# Patient Record
Sex: Female | Born: 1985 | Race: Black or African American | Hispanic: No | Marital: Single | Smoking: Former smoker
Health system: Southern US, Community
[De-identification: ages and names within clinical notes are randomized; demographics above are authoritative.]

## PROBLEM LIST (undated history)

## (undated) DIAGNOSIS — A048 Other specified bacterial intestinal infections: Secondary | ICD-10-CM

## (undated) DIAGNOSIS — K219 Gastro-esophageal reflux disease without esophagitis: Secondary | ICD-10-CM

## (undated) DIAGNOSIS — M722 Plantar fascial fibromatosis: Secondary | ICD-10-CM

## (undated) DIAGNOSIS — D649 Anemia, unspecified: Secondary | ICD-10-CM

## (undated) DIAGNOSIS — T7840XA Allergy, unspecified, initial encounter: Secondary | ICD-10-CM

## (undated) DIAGNOSIS — H5010 Unspecified exotropia: Secondary | ICD-10-CM

## (undated) DIAGNOSIS — H50112 Monocular exotropia, left eye: Secondary | ICD-10-CM

## (undated) DIAGNOSIS — R51 Headache: Secondary | ICD-10-CM

## (undated) HISTORY — DX: Monocular exotropia, left eye: H50.112

## (undated) HISTORY — DX: Other specified bacterial intestinal infections: A04.8

## (undated) HISTORY — DX: Unspecified exotropia: H50.10

## (undated) HISTORY — DX: Headache: R51

## (undated) HISTORY — PX: OTHER SURGICAL HISTORY: SHX169

## (undated) HISTORY — DX: Gastro-esophageal reflux disease without esophagitis: K21.9

## (undated) HISTORY — DX: Anemia, unspecified: D64.9

## (undated) HISTORY — DX: Allergy, unspecified, initial encounter: T78.40XA

---
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
## 2005-05-03 ENCOUNTER — Other Ambulatory Visit: Admission: RE | Admit: 2005-05-03 | Discharge: 2005-05-03 | Payer: Self-pay | Admitting: Obstetrics and Gynecology

## 2005-05-05 ENCOUNTER — Encounter: Admission: RE | Admit: 2005-05-05 | Discharge: 2005-05-05 | Payer: Self-pay | Admitting: Obstetrics and Gynecology

## 2006-05-04 ENCOUNTER — Other Ambulatory Visit: Admission: RE | Admit: 2006-05-04 | Discharge: 2006-05-04 | Payer: Self-pay | Admitting: Obstetrics and Gynecology

## 2006-12-12 ENCOUNTER — Ambulatory Visit: Payer: Self-pay | Admitting: Family Medicine

## 2008-02-25 ENCOUNTER — Encounter (INDEPENDENT_AMBULATORY_CARE_PROVIDER_SITE_OTHER): Payer: Self-pay | Admitting: Family Medicine

## 2008-02-25 ENCOUNTER — Ambulatory Visit: Payer: Self-pay | Admitting: Family Medicine

## 2008-02-25 DIAGNOSIS — T148 Other injury of unspecified body region: Secondary | ICD-10-CM | POA: Insufficient documentation

## 2008-02-25 DIAGNOSIS — W57XXXA Bitten or stung by nonvenomous insect and other nonvenomous arthropods, initial encounter: Secondary | ICD-10-CM

## 2008-07-20 ENCOUNTER — Emergency Department (HOSPITAL_COMMUNITY): Admission: EM | Admit: 2008-07-20 | Discharge: 2008-07-20 | Payer: Self-pay | Admitting: Family Medicine

## 2008-07-21 ENCOUNTER — Emergency Department (HOSPITAL_COMMUNITY): Admission: EM | Admit: 2008-07-21 | Discharge: 2008-07-21 | Payer: Self-pay | Admitting: Emergency Medicine

## 2010-12-05 ENCOUNTER — Other Ambulatory Visit (HOSPITAL_COMMUNITY)
Admission: RE | Admit: 2010-12-05 | Discharge: 2010-12-05 | Disposition: A | Discharge status: Home / Self Care | Payer: BC Managed Care – PPO | Source: Ambulatory Visit | Attending: Family Medicine | Admitting: Family Medicine

## 2010-12-05 ENCOUNTER — Ambulatory Visit (INDEPENDENT_AMBULATORY_CARE_PROVIDER_SITE_OTHER): Payer: BC Managed Care – PPO | Admitting: Family Medicine

## 2010-12-05 ENCOUNTER — Encounter: Payer: Self-pay | Admitting: Family Medicine

## 2010-12-05 VITALS — BP 112/68 | HR 80 | Temp 100.2°F | Ht 67.5 in | Wt 180.0 lb

## 2010-12-05 DIAGNOSIS — N898 Other specified noninflammatory disorders of vagina: Secondary | ICD-10-CM

## 2010-12-05 DIAGNOSIS — Z124 Encounter for screening for malignant neoplasm of cervix: Secondary | ICD-10-CM

## 2010-12-05 DIAGNOSIS — Z01419 Encounter for gynecological examination (general) (routine) without abnormal findings: Secondary | ICD-10-CM | POA: Insufficient documentation

## 2010-12-05 DIAGNOSIS — Z20828 Contact with and (suspected) exposure to other viral communicable diseases: Secondary | ICD-10-CM

## 2010-12-05 MED ORDER — EPINEPHRINE 0.3 MG/0.3ML IJ DEVI: 0.3000 mg | Freq: Once | INTRAMUSCULAR | Status: DC

## 2010-12-05 NOTE — Patient Instructions (Signed)
It was nice to meet you today!  We are checking labs and will contact you when they are back.

## 2010-12-05 NOTE — Progress Notes (Signed)
  Subjective:     Jane Marshall is a 25 y.o. female and is here for a comprehensive physical exam. The patient reports no problems.  History   Social History  . Marital Status: Single    Spouse Name: N/A    Number of Children: N/A  . Years of Education: N/A   Occupational History  . Not on file.   Social History Main Topics  . Smoking status: Former Smoker    Types: Cigarettes  . Smokeless tobacco: Not on file  . Alcohol Use: Not on file  . Drug Use: Not on file  . Sexually Active: Not on file   Other Topics Concern  . Not on file   Social History Narrative  . No narrative on file   Health Maintenance  Topic Date Due  . Pap Smear  09/07/2004  . Tetanus/tdap  09/07/2005    The following portions of the patient's history were reviewed and updated as appropriate: allergies, current medications, past family history, past medical history, past social history, past surgical history and problem list.  Review of Systems Pertinent items are noted in HPI. No HA, dizziness, CP, SOB, N/V/D/C, LE edema.  Objective:    BP 112/68  Pulse 80  Temp(Src) 100.2 F (37.9 C) (Oral)  Ht 5' 7.5" (1.715 m)  Wt 180 lb (81.647 kg)  BMI 27.78 kg/m2  LMP 11/07/2010 General appearance: alert, cooperative and no distress Eyes: conjunctivae/corneas clear. PERRL, EOM's intact. Fundi benign. Ears: normal TM's and external ear canals both ears Nose: Nares normal. Septum midline. Mucosa normal. No drainage or sinus tenderness. Throat: lips, mucosa, and tongue normal; teeth and gums normal Neck: no adenopathy and thyroid not enlarged, symmetric, no tenderness/mass/nodules Back: no tenderness to percussion or palpation Lungs: clear to auscultation bilaterally Breasts: normal appearance, no masses or tenderness Heart: regular rate and rhythm, S1, S2 normal, no murmur, click, rub or gallop Abdomen: soft, non-tender; bowel sounds normal; no masses,  no organomegaly Pelvic: cervix normal in  appearance, external genitalia normal, no adnexal masses or tenderness, no cervical motion tenderness, uterus normal size, shape, and consistency and vagina normal without discharge Extremities: extremities normal, atraumatic, no cyanosis or edema Pulses: 2+ and symmetric Skin: Skin color, texture, turgor normal. No rashes or lesions or small resolving boil left inner thigh with no erythema, edema, or warmth.  Lymph nodes: Cervical, supraclavicular, and axillary nodes normal. Neurologic: Grossly normal    Assessment:    Healthy female exam.   Plan:     See After Visit Summary for Counseling Recommendations

## 2010-12-06 LAB — GC/CHLAMYDIA PROBE AMP, GENITAL: Chlamydia, DNA Probe: NEGATIVE

## 2010-12-09 ENCOUNTER — Encounter: Payer: Self-pay | Admitting: Family Medicine

## 2011-01-27 NOTE — Op Note (Signed)
[name]  Patient:    Jane Marshall, Jane Marshall                        MRN: [id number] Proc. Date: 03/07/01 Adm. 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  OPERATION PERFORMED:  Correction metatarsus prima vera and hallux valgus with distal first metatarsal osteotomy and exostosis excision.  Medial capsular reefing.  Release with capsulotomy, dorsal aspect, second MP joint to improve hyperextension contracture, second MP joint.  Fixation of osteotomy with bioabsorbable 2 mm Orthosorb pin.  SURGEON:  Loreta Ave, M.D.  ASSISTANT:  Arlys John D. Petrarca, P.A.-C.  ANESTHESIA:  General.  ESTIMATED BLOOD LOSS:  Minimal.  TOURNIQUET TIME:  45 minutes.  SPECIMENS:  None.  CULTURES:  None.  COMPLICATIONS:  None.  DRESSING:  Soft compressive with wooden shoe.  DESCRIPTION OF PROCEDURE:  The patient was brought to the operating room and placed on the operating table in supine position.  After adequate anesthesia had been obtained, tourniquet applied lower aspect of the right leg.  Prepped and draped in the usual sterile fashion.  Exsanguinated with elevation and Esmarch.  Tourniquet inflated to 250 mmHg.  Straight incision medial aspect of first MP joint.  The skin and subcutaneous tissues were divided.  Distal based capsular flap generated and retracted distally.  Exostosis distal first metatarsal excised with a saw.  Chevron osteotomy then created with fluoroscopic guidance.  Distal end of the first metatarsal transferred laterally 5 to 6 mm and then fixed with a premeasured predrilled 2 mm Orthosorb pin.  Remaining shaft then contoured smoothly  in line with the distal end after fixation of the osteotomy.  Wound irrigated.  Because of the contracture of the second MP joint, a longitudinal incision was made in the first web space.  Medial aspect of the second MP joint exposed, protecting the extensor tendon.  Capsulotomy all the way across the dorsal aspect of the second MP joint completed which resolved all fixed contracture and I could passively bring the second joint down into complete correction and into complete flexion.  Through that same incision, we then did a release of the lateral aspect first MP joint to allow correction of the hallux valgus.  Once complete, I then reefed the medial capsule first MP joint with a fixation with multiple 0 Vicryl sutures.  At completion, I had excellent alignment of all joints with good improvement of metatarsal prima vera osteotomy.  Overall alignment clinically and by C-arm was excellent.  Wounds were irrigated and closed with nylon at the skin.  Margins of the wound were injected with Marcaine.  Sterile compressive dressing with bulky trauma dressing and spacer between the first and second toes applied.  Wooden shoe applied.  Tourniquet inflated and removed.  Anesthesia reversed.  Brought to recovery room. Tolerated surgery well.  No complications. DD:  03/07/01 TD:  03/07/01 Job: [id number]

## 2011-01-27 NOTE — Op Note (Signed)
[place]  Patient:    Jane Marshall, Jane Marshall                        MRN: [id number] Proc. Date: 11/08/00 Adm. 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  POSTOPERATIVE DIAGNOSIS:  Metarsus prima varus with marked hallux valgus, left foot with hyperextension contracture, second metatarsophalangeal joint.  OPERATION PERFORMED:  Correction of hallux valgus deformity with excision of distal first metatarsal head.  Proximal first metatarsal dome osteotomy with 3.5 cannulated screw fixation.  Reefing first and second metatarsal heads with abductor hallucis release.  Release of second metatarsophalangeal joint for hyperextension contracture.  SURGEON:  Loreta Ave, M.D.  ASSISTANT:  Arlys John D. Petrarca, P.A.-C.  ANESTHESIA:  General.  ESTIMATED BLOOD LOSS:  Minimal.  TOURNIQUET TIME:  One hour.  SPECIMENS:  None.  CULTURES:  None.  COMPLICATIONS:  None.  DRESSING:  Soft compressive with Cam walker.  DESCRIPTION OF PROCEDURE:  The patient was brought to the operating room and placed on the operating table in supine position.  After adequate anesthesia had been obtained, tourniquet applied left calf.  Prepped and draped in the usual sterile fashion.  Exsanguinated with elevation and Esmarch.  Tourniquet inflated to 250 mmHg.  Three incisions made, one in the first webspace, one over the medial aspect of the first metatarsal head and one dorsal medial over the base of the first metatarsal.  At the base of the first metatarsal, subperiosteal exposure of the first metatarsal protecting neurovascular structures and tendons.  Dome osteotomy created avoiding the tarsal metatarsal joint.  This was sufficiently mobilized to allow for correction.  Attention was then turned to the first webspace.  Skin and subcutaneous  tissues divided. Blunt dissection used to expose the interval between the first and second metatarsal heads.  A release of the sesamoid was done longitudinally at the deep aspect laterally, first metatarsal joint.  The abductor and capsule were then released.  A nonabsorbable #1 Ethibond suture was then placed through the abductor through the capsule of the first metatarsal and the capsule of the second metatarsal and then tied down, correcting the distal gap between the metatarsal heads with motion created at the osteotomy.  The medial incision was then made.  The skin and subcutaneous tissues were divided.  Capsular flap created to reef the first metatarsal joint.  The prominent exostosis taken off the metatarsal head with a saw.  The wound irrigated.  No degenerative changers.  When this was complete, I went back to the base of the first metatarsal.   Corrected and then fixed with a 3.5 mm cannulated pin bringing the intermetatarsal angle to neutral.  The suture between the first and second metatarsal heads and the first webspace was then firmly tied.  At the distal medial aspect of the first metatarsal, the capsule was then reefed to acceptable slight valgus position with Vicryl.  Overall alignment was markedly improved but there was still hyperextension of the second MP joint.  This was corrected with release of the capsule dorsally, second MP joint.  Although it still dynamically tended to come up dorsally, it was very easily correctable and there were no flexion contractures.  Overall construct exam with  C-arm and alignments acceptable.  The wounds were all copiously irrigated and closed with Vicryl and nylon.  Margin of the wounds injected with Marcaine.  Sterile compressive dressing with bulky foot dressing and splint applied with Cam walker.  Tourniquet deflated and removed.  Anesthesia reversed.  Brought to recovery room.  Tolerated surgery well.  No complications. DD:   11/08/00 TD:  11/08/00 Job: [id number]

## 2011-05-26 ENCOUNTER — Ambulatory Visit (INDEPENDENT_AMBULATORY_CARE_PROVIDER_SITE_OTHER): Payer: BC Managed Care – PPO | Admitting: Family Medicine

## 2011-05-26 ENCOUNTER — Other Ambulatory Visit: Payer: Self-pay | Admitting: Family Medicine

## 2011-05-26 ENCOUNTER — Encounter: Payer: Self-pay | Admitting: Family Medicine

## 2011-05-26 VITALS — BP 123/84 | HR 94 | Temp 98.6°F | Wt 176.0 lb

## 2011-05-26 DIAGNOSIS — R1012 Left upper quadrant pain: Secondary | ICD-10-CM

## 2011-05-26 DIAGNOSIS — R51 Headache: Secondary | ICD-10-CM

## 2011-05-26 MED ORDER — OMEPRAZOLE 40 MG PO CPDR: 40.0000 mg | DELAYED_RELEASE_CAPSULE | Freq: Every day | ORAL | Status: DC

## 2011-05-26 MED ORDER — INDOMETHACIN 25 MG PO CAPS: 25.0000 mg | ORAL_CAPSULE | Freq: Two times a day (BID) | ORAL | Status: AC

## 2011-05-26 NOTE — Assessment & Plan Note (Signed)
I suspect this is gastritis. I need to evaluate whether this is recurrence of H. pylori or due to NSAID.   Plan to do urease breath test today.  If negative we will proceed with omeprazole.  If positive will proceed with quadruple therapy. Will followup results of tests.

## 2011-05-26 NOTE — Patient Instructions (Signed)
Thank you for coming in today. We will do the test today for your stomach pain.  I would like you to start on indomethacin 25 three times a day x 7 days.  We will follow up over phone on Wednesday or sooner. Call me at [phone]. It is also important that you take omeprazole to protect your stomach.

## 2011-05-26 NOTE — Progress Notes (Signed)
Ms. Jane Marshall presents to clinic today for headache and abdominal pain.  Headache: Starting for over 3 months but worse in the last 3 months. Headache is focused along the left eye it lasts from 30 minutes to one hour can occur up to 4 times a day and will often or occur multiple days in a row. She will sometimes have an interval of 2-3 days or she does not have headache. She denies any change in her visual acuity with headache. But she does note some floaters around the time she is having a headache. She denies any dizziness clumsiness weakness or numbness. She's been treating her headache with ibuprofen which has not helped much. Her sister was diagnosed with pseudotumor cerebra last year. And both her mother and her grandmother have had headaches.  Abdominal pain: Located in the epigastric region radiating to the left upper quadrant sharp in nature. Associated with diarrhea. The pain has been there for 6 days. The pain is constant but at times is worse.  Food may make it worse but not always. In high school she was diagnosed and treated for H. pylori there  PMH reviewed.  ROS as above otherwise neg Medications reviewed.  Exam:  BP 123/84  Pulse 94  Temp(Src) 98.6 F (37 C) (Oral)  Wt 176 lb (79.833 kg) Gen: Well NAD HEENT: EOMI,  MMM. Left eye exotropia. Optic discs have crisp margins and normal cup-to-disc ratios bilaterally. Lungs: CTABL Nl WOB Heart: RRR no MRG Abd: NABS, NT, ND. Mildly tender to palpation of the left upper quadrant no masses palpated over the entire abdomen. Exts: Non edematous BL  LE, warm and well perfused.  Neuro: Normal cranial nerves exam. Gait normal.

## 2011-05-26 NOTE — Assessment & Plan Note (Addendum)
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 I plan to treat with indomethacin 25 mg 3 times a day. I will follow up with phone on Wednesday with the patient.  If this is working we'll continue an attempt to taper.    Is not effective we'll increase to 50 mg 3 times a day for one week.  If indomethacin is not effective at all we'll suspect cluster headache.  At that point will use CT of the head as the odds ratio for cluster headache and intercranial abnormalities is 10, and start treatment with a triptan.

## 2011-06-13 LAB — POCT I-STAT, CHEM 8
BUN: 8 mg/dL (ref 6–23)
Calcium, Ion: 1.21 mmol/L (ref 1.12–1.32)
Chloride: 102 mEq/L (ref 96–112)
HCT: 45 % (ref 36.0–46.0)
Potassium: 4 mEq/L (ref 3.5–5.1)
Sodium: 139 mEq/L (ref 135–145)

## 2011-06-13 LAB — POCT URINALYSIS DIP (DEVICE)
Glucose, UA: NEGATIVE mg/dL
Nitrite: NEGATIVE
pH: >=9 (ref 5.0–8.0)

## 2011-06-13 LAB — WET PREP, GENITAL
Trich, Wet Prep: NONE SEEN
Yeast Wet Prep HPF POC: NONE SEEN

## 2011-06-13 LAB — POCT PREGNANCY, URINE: Preg Test, Ur: NEGATIVE

## 2011-06-13 LAB — GC/CHLAMYDIA PROBE AMP, GENITAL
Chlamydia, DNA Probe: POSITIVE — AB
GC Probe Amp, Genital: POSITIVE — AB

## 2011-10-18 ENCOUNTER — Telehealth: Payer: Self-pay | Admitting: Family Medicine

## 2011-10-18 ENCOUNTER — Other Ambulatory Visit (HOSPITAL_COMMUNITY)
Admission: RE | Admit: 2011-10-18 | Discharge: 2011-10-18 | Disposition: A | Discharge status: Home / Self Care | Payer: BC Managed Care – PPO | Source: Ambulatory Visit | Attending: Family Medicine | Admitting: Family Medicine

## 2011-10-18 ENCOUNTER — Encounter: Payer: Self-pay | Admitting: Family Medicine

## 2011-10-18 ENCOUNTER — Ambulatory Visit (INDEPENDENT_AMBULATORY_CARE_PROVIDER_SITE_OTHER): Payer: BC Managed Care – PPO | Admitting: Family Medicine

## 2011-10-18 VITALS — BP 113/74 | HR 84 | Ht 67.0 in | Wt 196.0 lb

## 2011-10-18 DIAGNOSIS — Z113 Encounter for screening for infections with a predominantly sexual mode of transmission: Secondary | ICD-10-CM | POA: Insufficient documentation

## 2011-10-18 DIAGNOSIS — Z20828 Contact with and (suspected) exposure to other viral communicable diseases: Secondary | ICD-10-CM

## 2011-10-18 LAB — POCT WET PREP (WET MOUNT): Clue Cells Wet Prep Whiff POC: POSITIVE

## 2011-10-18 MED ORDER — METRONIDAZOLE 500 MG PO TABS: 500.0000 mg | ORAL_TABLET | Freq: Two times a day (BID) | ORAL | Status: AC

## 2011-10-18 NOTE — Patient Instructions (Signed)
Thank you for coming in today, it was good to see you I will let you know the results of your tests when they return. Great job on quitting smoking!!  Keep it up! For your reflux you can try an over the counter medication called zantac (ranitidine) I will see you back in one year or sooner if needed

## 2011-10-18 NOTE — Progress Notes (Signed)
  Subjective:     Jane Marshall is a 26 y.o. female and is here for a comprehensive physical exam. The patient reports vaginal irritation x2 weeks.  Has not noticed discharge associated with this.  Does not believe she has had exposure to STD.  Last intercourse with female partner on 12/28, used condom at that time.  Denies abdominal pain, fever, dysuria.  History   Social History  . Marital Status: Single    Spouse Name: N/A    Number of Children: N/A  . Years of Education: N/A   Occupational History  . Not on file.   Social History Main Topics  . Smoking status: Former Smoker -- 0.2 packs/day    Types: Cigarettes, Cigars    Quit date: 09/12/2011  . Smokeless tobacco: Never Used  . Alcohol Use: 8.4 oz/week    14 Cans of beer per week     Binges on Weekend. Endorses sometimes needing "eye opener."  . Drug Use: No  . Sexually Active: Yes -- Female, Female partner(s)    Birth Control/ Protection: Condom     Bisexual   Other Topics Concern  . Not on file   Social History Narrative  . No narrative on file   Health Maintenance  Topic Date Due  . Tetanus/tdap  09/07/2005  . Influenza Vaccine  06/12/2011  . Pap Smear  12/04/2013    The following portions of the patient's history were reviewed and updated as appropriate: allergies, current medications, past family history, past medical history, past social history, past surgical history and problem list.  Review of Systems Pertinent items are noted in HPI.   Objective:    BP 113/74  Pulse 84  Ht 5\' 7"  (1.702 m)  Wt 196 lb (88.905 kg)  BMI 30.70 kg/m2 General appearance: alert, cooperative and no distress Head: Normocephalic, without obvious abnormality, atraumatic Eyes: conjunctivae/corneas clear. PERRL, EOM's intact. Fundi benign. Ears: normal TM's and external ear canals both ears Nose: Nares normal. Septum midline. Mucosa normal. No drainage or sinus tenderness. Throat: lips, mucosa, and tongue normal; teeth and gums  normal Neck: no adenopathy, supple, symmetrical, trachea midline and thyroid not enlarged, symmetric, no tenderness/mass/nodules Back: no tenderness to percussion or palpation Lungs: clear to auscultation bilaterally Heart: regular rate and rhythm, S1, S2 normal, no murmur, click, rub or gallop Abdomen: soft, non-tender; bowel sounds normal; no masses,  no organomegaly Pelvic: cervix normal in appearance, external genitalia normal, no adnexal masses or tenderness, no cervical motion tenderness, rectovaginal septum normal, uterus normal size, shape, and consistency and vagina with scant discharge Extremities: extremities normal, atraumatic, no cyanosis or edema Pulses: 2+ and symmetric Skin: Skin color, texture, turgor normal. No rashes or lesions Lymph nodes: Cervical, supraclavicular, and axillary nodes normal. Neurologic: Grossly normal    Assessment:    Healthy female exam.       Plan:     See After Visit Summary for Counseling Recommendations   Wet Prep, GC/Chlamydia done.

## 2011-10-18 NOTE — Telephone Encounter (Signed)
Called patient to give her results of wet prep.  Wet prep with BV will send over rx for flagyl x7 days.

## 2012-04-02 ENCOUNTER — Ambulatory Visit (INDEPENDENT_AMBULATORY_CARE_PROVIDER_SITE_OTHER): Payer: BC Managed Care – PPO | Admitting: Family Medicine

## 2012-04-02 ENCOUNTER — Encounter: Payer: Self-pay | Admitting: Family Medicine

## 2012-04-02 VITALS — BP 120/81 | HR 87 | Temp 99.1°F | Ht 67.0 in | Wt 191.0 lb

## 2012-04-02 DIAGNOSIS — R1110 Vomiting, unspecified: Secondary | ICD-10-CM | POA: Insufficient documentation

## 2012-04-02 LAB — POCT URINE PREGNANCY: Preg Test, Ur: NEGATIVE

## 2012-04-02 MED ORDER — ONDANSETRON HCL 4 MG PO TABS: 4.0000 mg | ORAL_TABLET | Freq: Three times a day (TID) | ORAL | Status: AC | PRN

## 2012-04-02 MED ORDER — OMEPRAZOLE 20 MG PO CPDR: 20.0000 mg | DELAYED_RELEASE_CAPSULE | Freq: Every day | ORAL | Status: DC

## 2012-04-02 NOTE — Patient Instructions (Signed)
Thank you for coming in today, it was good to see you I think your nausea and vomiting is likely related to your reflux I would like for you to try a medication called omeprazole.  I am also sending a medication for nausea in that you can use as needed.   If this is not improving in the next couple of weeks please return to see me.

## 2012-04-08 NOTE — Assessment & Plan Note (Addendum)
Suspect her nausea and vomiting and LUQ pain is likely from gastritis.  Negative urease breath test in 05/2011.  Will change her over from H2 blocker to PPI.  Pregnancy test negative. If no improvement can consider repeat urease breath test. Less likely gastroenteritis (no diarrhea), SBO ( having normal BM), and clinically does not appear to be pancreatitis.

## 2012-04-08 NOTE — Progress Notes (Signed)
  Subjective:    Patient ID: Jane Marshall, female    DOB: 1985/09/23, 26 y.o.   MRN: [id number]  HPI  1. Nausea/Emesis:  Patient with complaint of nausea and emesis that has been going on for a couple of weeks.  Vomiting only in the morning.  Typically only mucus or stomach contents.   Still having difficulty with increased heartburn, currently using ranitidine.   Occasional mild LUQ and epigastric pain, described as sharp and crampy at times. No difficulty with appetite or eating throughout the rest of the day.  Bowel movements have been normal.  She denies fever, chills, diarrhea, bloody or dark tarry stools.  Reports LMP on 7/01, does not believe she is pregnant.   Review of Systems Per HPI    Objective:   Physical Exam  Constitutional: She appears well-nourished. No distress.  Neck: Neck supple.  Cardiovascular: Normal rate and regular rhythm.   Pulmonary/Chest: Effort normal and breath sounds normal.  Abdominal: Soft. Bowel sounds are normal. She exhibits no distension. There is tenderness (Mild epigastric). There is no rebound and no guarding.          Assessment & Plan:

## 2013-10-29 ENCOUNTER — Encounter: Payer: Self-pay | Admitting: Family Medicine

## 2013-10-29 ENCOUNTER — Telehealth: Payer: Self-pay | Admitting: Family Medicine

## 2013-10-29 ENCOUNTER — Ambulatory Visit (INDEPENDENT_AMBULATORY_CARE_PROVIDER_SITE_OTHER): Payer: BC Managed Care – PPO | Admitting: Family Medicine

## 2013-10-29 VITALS — BP 119/88 | HR 91 | Temp 97.8°F | Ht 67.0 in | Wt 208.0 lb

## 2013-10-29 DIAGNOSIS — R1012 Left upper quadrant pain: Secondary | ICD-10-CM

## 2013-10-29 LAB — POCT URINALYSIS DIPSTICK
Bilirubin, UA: NEGATIVE
Blood, UA: NEGATIVE
GLUCOSE UA: NEGATIVE
KETONES UA: NEGATIVE
Leukocytes, UA: NEGATIVE
Nitrite, UA: NEGATIVE
Protein, UA: NEGATIVE
UROBILINOGEN UA: 0.2
pH, UA: 6

## 2013-10-29 LAB — POCT URINE PREGNANCY: PREG TEST UR: NEGATIVE

## 2013-10-29 MED ORDER — PROMETHAZINE HCL 25 MG PO TABS: 25.0000 mg | ORAL_TABLET | Freq: Three times a day (TID) | ORAL | Status: DC | PRN

## 2013-10-29 NOTE — Progress Notes (Signed)
Subjective:     Patient ID: Jane Marshall, female   DOB: 07-16-1986, 28 y.o.   MRN: [id number]  HPI Comments: She report N/V/D that began this morning at 5am. She reports sick contacts with her sister's kids yesterday who had GI symptoms. She also endorses LUQ abdominal pain that began after the vomiting which she describes as sharp constant non-radiating 8/10 pain. She reports chills but denies fevers. She denies any dysuria, Hx of kidney stone, UTIs, or ovarian cyst. LMP was ~ 6 weeks ago and [facility]. Denies recent alcohol or illicit drug use.     Diarrhea  Associated symptoms include abdominal pain, chills and vomiting.     Review of Systems  Constitutional: Positive for chills and appetite change.  Gastrointestinal: Positive for nausea, vomiting, abdominal pain and diarrhea. Negative for constipation and blood in stool.  Genitourinary: Negative for dysuria, frequency, hematuria, flank pain, difficulty urinating and menstrual problem.  Skin: Negative for rash.       Objective:   Physical Exam  Constitutional: She appears well-developed and well-nourished.  HENT:  Mouth/Throat: Oropharynx is clear and moist.  Cardiovascular: Normal rate, regular rhythm and normal heart sounds.   Pulmonary/Chest: Effort normal and breath sounds normal.  Abdominal:  LUQ tenderness to palpation. No rash, rebounding or guarding     Assessment/Plan:      See Problem Focused Assessment & Plan

## 2013-10-29 NOTE — Telephone Encounter (Signed)
Will fwd to Dr.Joyner who saw patient today to see if this is possible.  Grabiela Wohlford, Darlyne RussianKristen L, CMA

## 2013-10-29 NOTE — Assessment & Plan Note (Signed)
Pertinent S&O  N/V/D with know sick contacts  No hx of UTI, kidney stones or ovarian cyst  LMP ~ 6 wks Assessment  Check UA and Upreg Plan  Zofran for Nausea   Return to clinic for new/worsening symptoms

## 2013-10-29 NOTE — Telephone Encounter (Signed)
Would like an extra day on her return to work note. Would like to go back to work on Monday Please advise

## 2013-10-29 NOTE — Patient Instructions (Addendum)
It was great seeing you today.   1. You most likely have a viral GI bug 2. Zofran will help reduce the Nausea and vomiting; and Try and drink fluids to stay hydrated  3. Please call or return to clinic if you have new or worsening symptoms or are unable to keep enough fluids down to stay hydrated  I look forward to talking with you again at our next visit. If you have any questions or concerns before then, please call the clinic at [phone].  Take Care,   Dr Wenda LowJames Solomon Skowronek

## 2014-10-15 ENCOUNTER — Encounter: Payer: Self-pay | Admitting: Family Medicine

## 2014-10-15 ENCOUNTER — Other Ambulatory Visit (HOSPITAL_COMMUNITY)
Admission: RE | Admit: 2014-10-15 | Discharge: 2014-10-15 | Disposition: A | Discharge status: Home / Self Care | Payer: BC Managed Care – PPO | Source: Ambulatory Visit | Attending: Family Medicine | Admitting: Family Medicine

## 2014-10-15 ENCOUNTER — Ambulatory Visit (INDEPENDENT_AMBULATORY_CARE_PROVIDER_SITE_OTHER): Payer: BC Managed Care – PPO | Admitting: Family Medicine

## 2014-10-15 VITALS — BP 138/90 | HR 69 | Temp 98.4°F | Ht 67.0 in | Wt 214.9 lb

## 2014-10-15 DIAGNOSIS — Q6689 Other  specified congenital deformities of feet: Secondary | ICD-10-CM

## 2014-10-15 DIAGNOSIS — M21612 Bunion of left foot: Secondary | ICD-10-CM | POA: Insufficient documentation

## 2014-10-15 DIAGNOSIS — R0789 Other chest pain: Secondary | ICD-10-CM

## 2014-10-15 DIAGNOSIS — Z01419 Encounter for gynecological examination (general) (routine) without abnormal findings: Secondary | ICD-10-CM | POA: Insufficient documentation

## 2014-10-15 DIAGNOSIS — A499 Bacterial infection, unspecified: Secondary | ICD-10-CM

## 2014-10-15 DIAGNOSIS — N760 Acute vaginitis: Secondary | ICD-10-CM

## 2014-10-15 DIAGNOSIS — M21611 Bunion of right foot: Secondary | ICD-10-CM | POA: Insufficient documentation

## 2014-10-15 DIAGNOSIS — Z124 Encounter for screening for malignant neoplasm of cervix: Secondary | ICD-10-CM | POA: Insufficient documentation

## 2014-10-15 DIAGNOSIS — Z113 Encounter for screening for infections with a predominantly sexual mode of transmission: Secondary | ICD-10-CM

## 2014-10-15 DIAGNOSIS — B9689 Other specified bacterial agents as the cause of diseases classified elsewhere: Secondary | ICD-10-CM | POA: Insufficient documentation

## 2014-10-15 DIAGNOSIS — L309 Dermatitis, unspecified: Secondary | ICD-10-CM

## 2014-10-15 DIAGNOSIS — Q666 Other congenital valgus deformities of feet: Secondary | ICD-10-CM

## 2014-10-15 LAB — POCT WET PREP (WET MOUNT): Clue Cells Wet Prep Whiff POC: POSITIVE

## 2014-10-15 MED ORDER — METRONIDAZOLE 500 MG PO TABS: 500.0000 mg | ORAL_TABLET | Freq: Two times a day (BID) | ORAL | Status: DC

## 2014-10-15 NOTE — Patient Instructions (Signed)
Dear Jane HolmesHope Marshall, Thank you for coming in to clinic today. It was good to meet you!  1. Pap Smear done today - you will receive letter with results 2. Wet prep showed Bacterial Vaginosis (BV) - treat with Metronidazole 500mg  twice daily for 7 days (no alcohol on this medication) - if you develop a yeast infection, please call back and we can prescribe medicine for this. 3. Your STD testing will result in a few days, we can call you with results 4. Try topical moisturizer (Aveeno, Eucerin) 1-2 x daily on dry skin, especially after shower. Also use OTC Hydrocortisone on body for eczema - use for 2-4 weeks if not improving return to clinic. Cautious with use on face 5. For Right ribcage pain - continue to take Ibuprofen as needed, may try Topical like Icy-hot for pain relief. If persistent or worsening, please return for evaluation. 6. We could do mammogram in future if concerns, continue self breast exams  I sent referral to [facility] for referral for your Toe Evaluation - you may call Dr. Greig RightMurphy's office at: [address], [place], [place] [address] [phone]  Please schedule a follow-up appointment with Dr. Althea CharonKaramalegos in 1 year for annual physical or sooner if needed.  If you have any other questions or concerns, please feel free to call the clinic to contact me. You may also schedule an earlier appointment if necessary.  However, if your symptoms get significantly worse, please go to the Emergency Department to seek immediate medical attention.  Saralyn PilarAlexander Arlean Thies, DO [facility]

## 2014-10-15 NOTE — Progress Notes (Signed)
   Subjective:    Patient ID: Jane Marshall, female    DOB: 1986-04-19, 29 y.o.   MRN: [id number]  HPI  STD TESTING - Reports prior history of STDs (confirmed with chart review): Gonorrhea / Chlamydia (positive, 2009), last tested 2012 (negative). Positive Trichomonas (2013). Last HIV test (negative, 2012). - Active sexually with girlfriend, no known STDs - No longer on contraception - Denies dysuria, vaginal discharge, odor, abd or flank pain, fevers/chills  CHEST WALL PAIN: - Right lateral chest wall / rib pain started about 1 week ago with "needle-like sharp" pain radiating into side of breast, 1-2x daily (worse at night), lasting 5 minutes, usually starts when sitting back or sitting down, improved if rubs area and improved when stands often. History of fibrocystic changes in bilateral breasts, started around menstrual cycle, intermittently, previously with Left breast US 2006. 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 Eulah PontMurphy - Admits to recently over past several months gradual worsening Left great toe / foot pain worse when walking long distances - Has not followed up with Ortho since surgery, stated previously planned for "second surgery" but never had it done. - Denies any numbness, tingling, weakness, swelling or redness  ECZEMA / DRY SKIN: - Several areas of dry itchy skin patches developed on upper back, developed over past 1 month, worsening within past 1-2 weeks. Worse with cold weather. - Not tried any topical medicine. Occasional topical lotion  HM: - Request pap smear today. Last Pap 11/2010 (negative)  Family Hx: -  History adopted (recently learned of some family history but does not know full details, some history of breast cancer but unknown relation or age)  I have reviewed and updated the following as appropriate: allergies and current medications  Social Hx: - Former smoker (quit 2013) - Occasional alcohol - Denies any drug use  Review of Systems  See above HPI    Objective:   Physical Exam  BP 138/90 mmHg  Pulse 69  Temp(Src) 98.4 F (36.9 C) (Oral)  Ht 5\' 7"  (1.702 m)  Wt 214 lb 14.4 oz (97.478 kg)  BMI 33.65 kg/m2  LMP 09/30/2014  Gen - well-appearing, comfortable, NAD HEENT - NCAT, PERRL, EOMI, patent nares w/o congestion, oropharynx clear, MMM Neck - supple Heart - RRR, no murmurs heard Chest Wall - Right lateral mid ribcage with mild +TTP Lungs - CTAB, no wheezing, crackles, or rhonchi. Normal work of breathing. Ext - non-tender, no edema, peripheral pulses intact +2 b/l MSK - bilateral feet with hallux valgus deformity (Left > Right) with post-surgical scars noted. Moves all digits, normal str 5/5 bilateral ankle dorsi/plantar flexion Skin - warm, dry skin with several small dry flaky patches consistent with eczema on back and arms, no rashes over chest wall.  Pelvic Exam Normal external female genitalia. Vaginal canal without lesions. Normal appearing cervix, without lesions or bleeding. Physiologic discharge on exam. Bimanual exam without masses or cervical motion tenderness. Pap smear collected with minimal bleeding.  Exam chaperoned by Maree Erieeseree Blount, CMA     Assessment & Plan:   See specific A&P problem list for details.

## 2014-10-16 DIAGNOSIS — L309 Dermatitis, unspecified: Secondary | ICD-10-CM | POA: Insufficient documentation

## 2014-10-16 DIAGNOSIS — R0789 Other chest pain: Secondary | ICD-10-CM | POA: Insufficient documentation

## 2014-10-16 LAB — GC/CHLAMYDIA PROBE AMP (~~LOC~~) NOT AT ARMC
Chlamydia: NEGATIVE
Neisseria Gonorrhea: NEGATIVE

## 2014-10-16 LAB — RPR

## 2014-10-16 LAB — HIV ANTIBODY (ROUTINE TESTING W REFLEX): HIV 1&2 Ab, 4th Generation: NONREACTIVE

## 2014-10-16 NOTE — Assessment & Plan Note (Signed)
Wet prep consistent with BV (+clue, whiff)  Plan: 1. Metronidazole 500mg  BID x 7 days - avoid alcohol 2. If develop yeast infection / change symptoms, advised to call back consider empiric diflucan vs return for repeat wet prep

## 2014-10-16 NOTE — Assessment & Plan Note (Signed)
Last pap smear 2012 (negative). No history of abnml  Plan: 1. Collected Pap Smear - send letter with results

## 2014-10-16 NOTE — Assessment & Plan Note (Signed)
Chronic bilateral congenital hallux valgus deformity, s/p surgical correction b/l 2002 by Dr. Eulah PontMurphy  Plan: 1. Referral to [facility] for evaluation for possible repeat surgery / management

## 2014-10-16 NOTE — Assessment & Plan Note (Signed)
Acute x 1 week brief intermittent sharp atypical R-chest wall pains, likely MSK etiology with reproducible ribcage pain and improved with position, NSAIDs, and massage. - No typical chest pain symptoms or SOB. Vitals stable w/o tachycardia. No concern for DVT/PE  Plan: 1. Continue NSAIDs, moist heat PRN 2. RTC if worsening or persistent x 2 weeks 3. Red flag symptoms given

## 2014-10-16 NOTE — Assessment & Plan Note (Signed)
Consistent with dry atopic dermatitis patches on back and arms  Plan: 1. Advised OTC topical hydrocortisone (not to use on face) up to 2 weeks 2. Eczema precautions given to avoid worsening 3. Recommend daily moisturizer

## 2014-10-16 NOTE — Assessment & Plan Note (Signed)
Requested complete STD testing. Known hx prior STDs (GC / Chlamydia 2009, s/p treated with negative tests since). Last HIV neg 2012. - Asymptomatic, afebrile  Plan: 1. HIV, RPR - non-reactive 2. GC / Chlamydia probe - negative / negative 3. Wet prep - negative for trich

## 2014-10-20 LAB — CYTOLOGY - PAP

## 2014-10-21 ENCOUNTER — Encounter: Payer: Self-pay | Admitting: Family Medicine

## 2015-04-12 ENCOUNTER — Other Ambulatory Visit: Payer: Self-pay | Admitting: Physician Assistant

## 2015-04-12 NOTE — H&P (Signed)
Jane Marshall is an old patient of mine I haven't seen in many years.  She comes in for worsening issues with her left foot.  Reaching a point that she wants to consider definitive treatment.  I last saw her and took care of her back around 2000.  She had longstanding flexible pes planus.  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  The metatarsus prima vara and divergence was much more prominent on the left foot, not significant on the right.  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  She underwent a Chevron distal osteotomy correction of hallux valgus on the right foot.  Although over the years she has had a little bit of recurrent drifting of her great toe she remains asymptomatic on that side, pleased with her results and there are really no issues.  On the left I did a proximal first metatarsal osteotomy fixed with an interfragmentary 3.5 screw.  Then a soft tissue correction distally, three incision technique.  Osteotomy healed.  Good initial results.  The wideness of her forefoot remains improved, but over the years she has had further drifting and hallux valgus deformity of her great toe.  The great toe now drifts under the second toe.  This is more and more symptomatic and this is what she would like to address.  No new traumatic injury.   Remaining history is reviewed, updated and included in the chart. 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    EXAMINATION: General exam is outlined and included in the chart.  Specifically, healthy appearing 29 year-old female.  Height: 5?8.  Weight: 214 pounds.  Ligamentous laxity is normal.  Neurovascularly intact both lower extremities.  She has flexible pes planus both sides with a little bit of an arch coming back as she sits.  Good ankle motion and stability.  No posterior tibial tendonitis.  No deformity of the lesser toes on either side.  On the right she does  have some mild correctable hallux valgus.  Well healed incision.  Good motion.  No crepitus.  No pain.  No hallux rigidus.  On the left the forefoot correction from the metatarsus prima vara looks good, but she has marked drifting of the great toe under the second toe.  This could be corrected and there is no fixed deformities of the lesser toes.  There is no a lot of grating or crepitus.  There is really not prominent bone because of her proximal osteotomy.  No hallux rigidus.      X-RAYS: Three view x-rays obtained of the symptomatic left foot, including a standing AP view.  Correction of her metatarsus prima vara looks good.  The drift over of the first toe is obvious, but again there are no significant degenerative changes.      DISPOSITION:  1. In regards to the right foot, really nothing needs to be done.  She is aware this may drift further over time and will let me know if symptoms occur.   2. In regards to the left foot, this is more pressing and symptomatic.  In addition to the recurrent hallux valgus, she is also tender over the screw head at the base of the first metatarsal.  We have proposed definitive treatment.  On the left I am going to do a distal procedure with a Chevron osteotomy and then soft tissue correction of her hallux valgus.  I will  also go ahead and remove the screw from the proximal end of the first metatarsal, same time.  Hopefully fixation with OrthoSorb bioabsorbable pins distally.  Procedure, risks, benefits and complications reviewed.  Paperwork complete.  All questions answered.  She is going to look into her options at work, but I have talked to her about her job and it could be that we have to keep her out of work until completely healed, up to 12 weeks.  It is really going to depend on modifications that can be made at work, if at all.    Loreta Ave, M.D.

## 2015-04-22 ENCOUNTER — Ambulatory Visit (HOSPITAL_BASED_OUTPATIENT_CLINIC_OR_DEPARTMENT_OTHER)
Admission: RE | Admit: 2015-04-22 | Payer: BC Managed Care – PPO | Source: Ambulatory Visit | Admitting: Orthopedic Surgery

## 2015-04-22 ENCOUNTER — Encounter (HOSPITAL_BASED_OUTPATIENT_CLINIC_OR_DEPARTMENT_OTHER): Admission: RE | Payer: Self-pay | Source: Ambulatory Visit

## 2015-04-22 SURGERY — REMOVAL, HARDWARE
Anesthesia: General | Site: Foot | Laterality: Left

## 2015-10-11 ENCOUNTER — Ambulatory Visit (INDEPENDENT_AMBULATORY_CARE_PROVIDER_SITE_OTHER): Payer: BC Managed Care – PPO | Admitting: Family Medicine

## 2015-10-11 ENCOUNTER — Encounter: Payer: Self-pay | Admitting: Family Medicine

## 2015-10-11 VITALS — BP 112/64 | HR 75 | Temp 98.3°F | Wt 210.0 lb

## 2015-10-11 DIAGNOSIS — M79674 Pain in right toe(s): Secondary | ICD-10-CM | POA: Insufficient documentation

## 2015-10-11 MED ORDER — OXYCODONE-ACETAMINOPHEN 5-325 MG PO TABS: 1.0000 | ORAL_TABLET | Freq: Four times a day (QID) | ORAL | Status: DC | PRN

## 2015-10-11 NOTE — Progress Notes (Signed)
   Subjective:   Jane Marshall is a 30 y.o. female with a history of hallux valgus s/p attempted surgical correction here for toe pain  Pt reports that Saturday night she was walking in the dark barefoot and stubbed her right great toe on a cabinet. It has been painful and swollen since then. She cannot bare weight on that side of the foot and has been walking on the outer edge of her foot, it hurts too much to move the toe. The pain radiates up to midfoot and is sharp and throbbing. The foot has been operated on in that area by Dr. Eulah Pont years ago.  Review of Systems:  Per HPI. All other systems reviewed and are negative.   PMH, PSH, Medications, Allergies, and FmHx reviewed and updated in EMR.  Social History: former smoker  Objective:  BP 112/64 mmHg  Pulse 75  Temp(Src) 98.3 F (36.8 C) (Oral)  Wt 210 lb (95.255 kg)  LMP 09/16/2015  Gen:  30 y.o. female in NAD HEENT: NCAT, MMM, EOMI, PERRL, anicteric sclerae CV: RRR, no MRG, no JVD Resp: Non-labored, CTAB, no wheezes noted Abd: Soft, NTND, BS present, no guarding or organomegaly Ext: WWP, no edema MSK: R great toe with valgus deviation, unable to move 2/2 pain, swelling and bruising in area of distal first metatarsal, extremely tender throughout the area Neuro: Alert and oriented, speech normal      Chemistry      Component Value Date/Time   NA 139 07/21/2008 1901   K 4.0 07/21/2008 1901   CL 102 07/21/2008 1901   BUN 8 07/21/2008 1901   CREATININE 1.2 07/21/2008 1901   No results found for: CALCIUM, ALKPHOS, AST, ALT, BILITOT    Lab Results  Component Value Date   HGB 15.3* 07/21/2008   HCT 45.0 07/21/2008   No results found for: TSH No results found for: HGBA1C Assessment & Plan:     [facility] Jane Marshall is a 30 y.o. female here for toe pain  Pain of right great toe Stubbed toe on cabinet Saturday, pain and swelling since then, unable to bear weight or wiggle toe 2/2 pain. History of surgery for hallux valgus  bilaterally - concern for metatarsal vs. phallangeal fx, refer to ortho, murphy, for xray and eval  - crutches and shoe for protection until then, no weight bearing on that foot - ibuprofen, ice, percocet prn pain - has ortho appt tomorrow but rec she go to their walk-in clinic tonight, she agrees    Beverely Low, MD, MPH [facility] PGY-3 10/11/2015 12:15 PM

## 2015-10-11 NOTE — Assessment & Plan Note (Signed)
Stubbed toe on cabinet Saturday, pain and swelling since then, unable to bear weight or wiggle toe 2/2 pain. History of surgery for hallux valgus bilaterally - concern for metatarsal vs. phallangeal fx, refer to ortho, murphy, for xray and eval  - crutches and shoe for protection until then, no weight bearing on that foot - ibuprofen, ice, percocet prn pain - has ortho appt tomorrow but rec she go to their walk-in clinic tonight, she agrees

## 2015-10-11 NOTE — Patient Instructions (Signed)
I think your toe may be broken. I would like you to be seen by Dr. Eulah Pont and xrayed either tonight or tomorrow morning. Stay off the foot until then and use ibuprofen, ice and percocet as needed to control the pain.

## 2016-06-27 ENCOUNTER — Encounter: Payer: Self-pay | Admitting: Student

## 2016-06-27 ENCOUNTER — Other Ambulatory Visit (HOSPITAL_COMMUNITY)
Admission: RE | Admit: 2016-06-27 | Discharge: 2016-06-27 | Disposition: A | Discharge status: Home / Self Care | Payer: BC Managed Care – PPO | Source: Ambulatory Visit | Attending: Family Medicine | Admitting: Family Medicine

## 2016-06-27 ENCOUNTER — Ambulatory Visit (INDEPENDENT_AMBULATORY_CARE_PROVIDER_SITE_OTHER): Payer: BC Managed Care – PPO | Admitting: Student

## 2016-06-27 VITALS — BP 122/82 | HR 60 | Temp 98.3°F | Ht 67.0 in | Wt 211.8 lb

## 2016-06-27 DIAGNOSIS — N898 Other specified noninflammatory disorders of vagina: Secondary | ICD-10-CM

## 2016-06-27 DIAGNOSIS — Z23 Encounter for immunization: Secondary | ICD-10-CM

## 2016-06-27 DIAGNOSIS — Z7251 High risk heterosexual behavior: Secondary | ICD-10-CM

## 2016-06-27 DIAGNOSIS — Z113 Encounter for screening for infections with a predominantly sexual mode of transmission: Secondary | ICD-10-CM | POA: Insufficient documentation

## 2016-06-27 DIAGNOSIS — N760 Acute vaginitis: Secondary | ICD-10-CM | POA: Insufficient documentation

## 2016-06-27 LAB — POCT WET PREP (WET MOUNT)
CLUE CELLS WET PREP WHIFF POC: NEGATIVE
TRICHOMONAS WET PREP HPF POC: ABSENT

## 2016-06-27 MED ORDER — METRONIDAZOLE 500 MG PO TABS: 500.0000 mg | ORAL_TABLET | Freq: Two times a day (BID) | ORAL | 0 refills | Status: AC

## 2016-06-27 NOTE — Patient Instructions (Addendum)
It was great seeing you today! We have addressed the following issues today  1. Vaginal itching/burning: This could be due to new detergents or body wash. Your test is also concerning for bacterial vaginosis. I have sent a prescription for metronidazole to the pharmacy. Please take this medication until you complete the course. Someone will get in touch with you if your other results are abnormal. Otherwise, you will get a letter in mail   If we did any lab work today, and the results require attention, either me or my nurse will get in touch with you. If everything is normal, you will get a letter in mail. If you don't hear from us in two weeks, please give us a call. Otherwise, we look forward to seeing you again at your next visit. If you have any questions or concerns before then, please call the clinic at [phone].   Please bring all your medications to every doctors visit   Sign up for My Chart to have easy access to your labs results, and communication with your Primary care physician.     Please check-out at the front desk before leaving the clinic.    Take Care,

## 2016-06-27 NOTE — Assessment & Plan Note (Addendum)
Wet prep remarkable for few clue cells. Gave prescription for metronidazole. This could also be due to new detergent or body wash. Advised her to stop using any detergents. GC/CT/trichomoniasis/HIV/HBV pending.

## 2016-06-27 NOTE — Progress Notes (Signed)
   Subjective:    Patient ID: Jane Marshall is a 30 y.o. old female.  HPI  #Vaginal pruritis/burning: this has been going on for three days. Vaginal discharge. Not able to characterize as she is also on period. LMP 06/25/2016. Her symptoms are improving. Denies dysuria, increased freq, fever, chills, abdominal pain (but regular cramping), new back pain. Not on birth control. Started new body wash from dollar store. Different brand from what she usually uses. New laundary soap for 6 days.  Sexually active with female partner. Practice oral sex. Denies sore throat, cough or voice change.   PMH: reviewed  SH: Denies smoking or drug use  Review of Systems Per HPI Objective:   Vitals:   06/27/16 1345  BP: 122/82  Pulse: 60  Temp: 98.3 F (36.8 C)  TempSrc: Oral  SpO2: 99%  Weight: 211 lb 12.8 oz (96.1 kg)  Height: 5\' 7"  (1.702 m)    GEN: appears well, no apparent distress. Oropharynx: mmm, negative for erythema or exudation CVS: RRR, normal s1 and s2, no murmurs, no edema RESP: no increased work of breathing, good air movement bilaterally, no crackles or wheeze GI: Bowel sounds present and normal, soft, non-tender,non-distended GU: Negative for suprapubic or CVA tenderness, external genitalia appears normal. Speculum exam remarkable for vaginal bleeding (patient is on her period), no other notable vaginal discharge, negative for vaginal or cervical lesion. Wet prep and as well for GC/CT/trich obtained HEM: Negative for inguinal lymphadenopathy NEURO: alert and oriented appropriately, no gross defecits  PSYCH: appropriate mood and affect     Assessment & Plan:  Vaginal discharge Wet prep remarkable for few clue cells. Gave prescription for metronidazole. This could also be due to new detergent or body wash. Advised her to stop using any detergents. GC/CT/trichomoniasis/HIV/HBV pending.    Patient declined flu vaccine today. She received her Tdap.

## 2016-06-28 LAB — CERVICOVAGINAL ANCILLARY ONLY
CHLAMYDIA, DNA PROBE: NEGATIVE
Neisseria Gonorrhea: NEGATIVE
Trichomonas: NEGATIVE

## 2016-07-10 ENCOUNTER — Encounter: Payer: Self-pay | Admitting: Student

## 2016-07-10 NOTE — Progress Notes (Signed)
Results letter. Wet prep remarkable for BV (treated). Gonorrhea, Chlamydia and Trichomonas are negative

## 2016-07-26 ENCOUNTER — Ambulatory Visit (INDEPENDENT_AMBULATORY_CARE_PROVIDER_SITE_OTHER): Payer: BC Managed Care – PPO | Admitting: Internal Medicine

## 2016-07-26 ENCOUNTER — Encounter: Payer: Self-pay | Admitting: Internal Medicine

## 2016-07-26 DIAGNOSIS — K047 Periapical abscess without sinus: Secondary | ICD-10-CM

## 2016-07-26 MED ORDER — AMOXICILLIN-POT CLAVULANATE 875-125 MG PO TABS: 1.0000 | ORAL_TABLET | Freq: Two times a day (BID) | ORAL | 0 refills | Status: DC

## 2016-07-26 MED ORDER — HYDROCODONE-ACETAMINOPHEN 5-325 MG PO TABS: 1.0000 | ORAL_TABLET | Freq: Four times a day (QID) | ORAL | 0 refills | Status: DC | PRN

## 2016-07-26 NOTE — Progress Notes (Signed)
   [facility] Jane CharsAsiyah Marcea Rojek, MD Phone: [phone]  Reason For Visit: Dental Pain   # Couple of months ago, she had temporary fillings placed. However was not able to follow-up with dentist for replacement of these fillings. States that the temporary filling came out and since Friday has had pain in her mouth, jaw throat and ear. She at times has headaches associated with this dental pain. Denies any fevers or chills no nausea. She is able to open her mouth fully and take pills. She plans to follow up with the dentist in the week.   Past Medical History Reviewed problem list.  Medications- reviewed and updated No additions to family history Social history- patient is a non-smoker  Objective: BP 134/71   Pulse 65   Temp 98.3 F (36.8 C) (Oral)   Wt 209 lb (94.8 kg)   LMP 06/24/2016   BMI 32.73 kg/m  Gen: NAD, alert, cooperative with exam Face: Slight swelling on left side compared to right. Positive for left sided lymphadenopathy. Moist mucosa membranes, dental caries noted left lower molars, no peritonsillar abscess noted, uvula midline   Assessment/Plan: See problem based a/p  Dental abscess Dental caries with infection, no trismus on exam, no peritonsillar abscess. Patient able to take pills. - Augmentin 875-125 BID for 14 days  - Norco - 20 pills  - Counseled patient it is very important for her to follow up with dentist in the next week

## 2016-07-26 NOTE — Patient Instructions (Addendum)
I will start you on Augmentin please take this every 12 hours. You should follow-up with your dentist within the next week. I will give you some pain medication, please take every 6 hours as needed.

## 2016-07-27 NOTE — Assessment & Plan Note (Signed)
Dental caries with infection, no trismus on exam, no peritonsillar abscess. Patient able to take pills. - Augmentin 875-125 BID for 14 days  - Norco - 20 pills  - Counseled patient it is very important for her to follow up with dentist in the next week

## 2016-08-28 ENCOUNTER — Ambulatory Visit (INDEPENDENT_AMBULATORY_CARE_PROVIDER_SITE_OTHER): Payer: BC Managed Care – PPO | Admitting: Family Medicine

## 2016-08-28 ENCOUNTER — Encounter: Payer: Self-pay | Admitting: Internal Medicine

## 2016-08-28 VITALS — BP 108/68 | HR 84 | Temp 98.7°F | Wt 200.0 lb

## 2016-08-28 DIAGNOSIS — B9789 Other viral agents as the cause of diseases classified elsewhere: Secondary | ICD-10-CM

## 2016-08-28 DIAGNOSIS — J069 Acute upper respiratory infection, unspecified: Secondary | ICD-10-CM | POA: Diagnosis not present

## 2016-08-28 MED ORDER — IPRATROPIUM BROMIDE 0.03 % NA SOLN: 2.0000 | Freq: Two times a day (BID) | NASAL | 12 refills | Status: DC

## 2016-08-28 MED ORDER — OXYMETAZOLINE HCL 0.05 % NA SOLN: 1.0000 | Freq: Two times a day (BID) | NASAL | 0 refills | Status: DC

## 2016-08-28 MED ORDER — GUAIFENESIN-DM 100-10 MG/5ML PO SYRP: 5.0000 mL | ORAL_SOLUTION | ORAL | 0 refills | Status: DC | PRN

## 2016-08-28 NOTE — Progress Notes (Signed)
    Subjective:  Jane Marshall is a 30 y.o. female who [facility] with a chief complaint of [facility].   HPI:  Cough Patient started feeling ill about a week ago with nausea, vomiting, and diarrhea. She had a tooth ache about 5 days ago and was treated with amoxicillin which improved her pain, though since that visit, has started having ear pain, sore throat and cough. She has had intermittent fevers and chills. She works at [employer] so she has had several sick contacts. She tried taking ibuprofen which has not seemed to help much. She also feels a lot of pressure in her ears.  ROS: Per HPI  PMH: Smoking history reviewed.    Objective:  Physical Exam: BP 108/68   Pulse 84   Temp 98.7 F (37.1 C) (Oral)   Wt 200 lb (90.7 kg)   LMP 07/26/2016   SpO2 96%   BMI 31.32 kg/m   Gen: NAD, resting comfortably HEENT: TMs with clear effusions bilaterally. MMM. OP clear. No LAD>  CV: RRR with no murmurs appreciated Pulm: NWOB, CTAB with no crackles, wheezes, or rhonchi GI: Normal bowel sounds present. Soft, Nontender, Nondistended. MSK: no edema, cyanosis, or clubbing noted Skin: warm, dry Neuro: grossly normal, moves all extremities Psych: Normal affect and thought content  Assessment/Plan:  Viral URI with Cough No signs of bacterial infection. Will treat with atrovent and 3 days of afrin. Will given robitussin for cough. Recommended continuing tylenol and motrin. Return precautions reviewed. Follow up as needed.   Katina Degreealeb M. Jimmey RalphParker, MD [facility] Resident PGY-3 08/28/2016 9:49 PM

## 2016-08-28 NOTE — Patient Instructions (Signed)
You have a viral infection.   Start the afrin and atrovent today. Stop using the afrin after 3 days, but continue the atrovent.  Use the cough syrup as needed.  If you are not improving by the end of the week, or your symptoms are worsening, please let us know.  Take care,  Dr Jimmey RalphParker

## 2016-10-11 ENCOUNTER — Telehealth: Payer: Self-pay | Admitting: Family Medicine

## 2016-10-11 NOTE — Telephone Encounter (Signed)
Pt has been having heel pain and would like to know what she could do for it at home. ep

## 2016-10-11 NOTE — Telephone Encounter (Signed)
Left message for patient to call nurse back.  Patient could use an ice pack to help with swelling, inflammation and pain, elevate foot, take Ibuprofen or tylenol to help with pain.  Those measures don't work to schedule an appointment.  Clovis PuMartin, Daundre Biel L, RN

## 2016-10-13 NOTE — Telephone Encounter (Signed)
Left message for patient to return nurse call if needed.  Clovis PuMartin, Tamika L, RN

## 2016-10-25 ENCOUNTER — Encounter: Payer: Self-pay | Admitting: Family Medicine

## 2016-10-25 NOTE — Progress Notes (Signed)
   Subjective:   Patient ID: Jane Marshall    DOB: [birth year], 31 y.o. female   MRN: [id number]  CC: "physical exam"  HPI: Jane Marshall is a 31 y.o. female who presents to clinic today for right heel pain. Problems discussed today are as follows:  Right heel pain: Onset 1 month ago, worse with prolonged standing or walking. Has h/o heel pain in past. Pain is intermittent and located on base of heel near arch. Has tried ibuprofen and ice without relief. Patient has congenital hallux valgus with h/o surgical reconstruction by Dr [facility] ortho and screw in L foot. Has not f/u since 2000.  ROS: complete ROS performed, see HPI for pertinent ROS.  PMFSH: Congenital hallux valgus, FHx father pancreatic cancer and mother heart and kidney disease. Smoking status reviewed. Medications reviewed.  Objective:   BP 118/82   Pulse 99   Temp 98.1 F (36.7 C) (Oral)   Ht 5\' 7"  (1.702 m)   Wt 210 lb 6.4 oz (95.4 kg)   LMP 10/11/2016   SpO2 99%   BMI 32.95 kg/m  Vitals and nursing note reviewed.  General: well nourished, well developed, in no acute distress with non-toxic appearance HEENT: normocephalic, atraumatic, moist mucous membranes CV: regular rate and rhythm without murmurs, rubs, or gallops, no lower extremity edema Lungs: clear to auscultation bilaterally with normal work of breathing Abdomen: soft, non-tender, non-distended, normoactive bowel sounds Skin: warm, dry, no rashes or lesions, cap refill < 2 seconds Extremities: warm and well perfused, normal tone, severe hallux valgus b/l with stable gait and tenderness to right heel only, no masses appreciated  Assessment & Plan:   Chronic pain of right heel Acute on chronic. R foot only. Obvious hallux valgus deformity b/l. No external pathology noted. No masses appreciated. Heel soft and tender. C/w plantar fascitis. Concerned deformity if enticing factor and needs evaluation by ortho. --Refrral to ortho for congenital hallux valgus  given lack of f/u since 2000 --Patient instructed to take tylenol with ibuprofen q6h PRN for pain --Pt to use frozen water bottle under foot to alleviate pain --Information about plantar fascitis given --Pt to f/u PRN  Orders Placed This Encounter  Procedures  . Ambulatory referral to Orthopedic Surgery    Referral Priority:   Routine    Referral Type:   Surgical    Referral Reason:   Specialty Services Required    Referred to Provider:   Macky Lowerimothy Ryan Murphy, MD    Requested Specialty:   Orthopedic Surgery    Number of Visits Requested:   1   No orders of the defined types were placed in this encounter.   Durward Parcelavid McMullen, DO [facility], PGY-1 10/26/2016 9:13 PM

## 2016-10-26 ENCOUNTER — Encounter: Payer: Self-pay | Admitting: Family Medicine

## 2016-10-26 ENCOUNTER — Ambulatory Visit (INDEPENDENT_AMBULATORY_CARE_PROVIDER_SITE_OTHER): Payer: BC Managed Care – PPO | Admitting: Family Medicine

## 2016-10-26 VITALS — BP 118/82 | HR 99 | Temp 98.1°F | Ht 67.0 in | Wt 210.4 lb

## 2016-10-26 DIAGNOSIS — M79671 Pain in right foot: Secondary | ICD-10-CM | POA: Diagnosis not present

## 2016-10-26 DIAGNOSIS — G8929 Other chronic pain: Secondary | ICD-10-CM | POA: Diagnosis not present

## 2016-10-26 NOTE — Assessment & Plan Note (Signed)
Acute on chronic. R foot only. Obvious hallux valgus deformity b/l. No external pathology noted. No masses appreciated. Heel soft and tender. C/w plantar fascitis. Concerned deformity if enticing factor and needs evaluation by ortho. --Refrral to ortho for congenital hallux valgus given lack of f/u since 2000 --Patient instructed to take tylenol with ibuprofen q6h PRN for pain --Pt to use frozen water bottle under foot to alleviate pain --Information about plantar fascitis given --Pt to f/u PRN

## 2016-10-26 NOTE — Patient Instructions (Signed)
It was a pleasure to meet you today. Please see below to review our plan for today's visit.  1. Your symptoms are consistent with plantars fasciitis (see information below). You can continue using Tylenol to together with ibuprofen as needed every 6 hours for pain relief. Try using a frozen water bottle and rolling it under your foot. Wearing comfortable shoes and avoiding stressing your feet is important to prevent worsening of symptoms. 2. I have placed a frontal back to Dr. Eulah PontMurphy, your orthopedic doctor who can further evaluate your feet. 3. Return to clinic in 1 year for annual physical, or sooner as needed.  Please call the clinic at [phone] if your symptoms worsen or you have any concerns. It was my pleasure to see you. -- Durward Parcelavid McMullen, DO [facility], PGY-1   Plantar Fasciitis Plantar fasciitis is a painful foot condition that affects the heel. It occurs when the band of tissue that connects the toes to the heel bone (plantar fascia) becomes irritated. This can happen after exercising too much or doing other repetitive activities (overuse injury). The pain from plantar fasciitis can range from mild irritation to severe pain that makes it difficult for you to walk or move. The pain is usually worse in the morning or after you have been sitting or lying down for a while. CAUSES This condition may be caused by:  Standing for long periods of time.  Wearing shoes that do not fit.  Doing high-impact activities, including running, aerobics, and ballet.  Being overweight.  Having an abnormal way of walking (gait).  Having tight calf muscles.  Having high arches in your feet.  Starting a new athletic activity. SYMPTOMS The main symptom of this condition is heel pain. Other symptoms include:  Pain that gets worse after activity or exercise.  Pain that is worse in the morning or after resting.  Pain that goes away after you walk for a few  minutes. DIAGNOSIS This condition may be diagnosed based on your signs and symptoms. Your health care provider will also do a physical exam to check for:  A tender area on the bottom of your foot.  A high arch in your foot.  Pain when you move your foot.  Difficulty moving your foot. You may also need to have imaging studies to confirm the diagnosis. These can include:  X-rays.  Ultrasound.  MRI. TREATMENT  Treatment for plantar fasciitis depends on the severity of the condition. Your treatment may include:  Rest, ice, and over-the-counter pain medicines to manage your pain.  Exercises to stretch your calves and your plantar fascia.  A splint that holds your foot in a stretched, upward position while you sleep (night splint).  Physical therapy to relieve symptoms and prevent problems in the future.  Cortisone injections to relieve severe pain.  Extracorporeal shock wave therapy (ESWT) to stimulate damaged plantar fascia with electrical impulses. It is often used as a last resort before surgery.  Surgery, if other treatments have not worked after 12 months. HOME CARE INSTRUCTIONS  Take medicines only as directed by your health care provider.  Avoid activities that cause pain.  Roll the bottom of your foot over a bag of ice or a bottle of cold water. Do this for 20 minutes, 3-4 times a day.  Perform simple stretches as directed by your health care provider.  Try wearing athletic shoes with air-sole or gel-sole cushions or soft shoe inserts.  Wear a night splint while sleeping, if  directed by your health care provider.  Keep all follow-up appointments with your health care provider. PREVENTION   Do not perform exercises or activities that cause heel pain.  Consider finding low-impact activities if you continue to have problems.  Lose weight if you need to. The best way to prevent plantar fasciitis is to avoid the activities that aggravate your plantar  fascia. SEEK MEDICAL CARE IF:  Your symptoms do not go away after treatment with home care measures.  Your pain gets worse.  Your pain affects your ability to move or do your daily activities. This information is not intended to replace advice given to you by your health care provider. Make sure you discuss any questions you have with your health care provider. Document Released: 05/23/2001 Document Revised: 12/20/2015 Document Reviewed: 07/08/2014 Elsevier Interactive Patient Education  2017 [employer].

## 2016-11-02 NOTE — Progress Notes (Signed)
   [facility] Phone: [phone]   Date of Visit: 11/03/2016   HPI:  Jane Marshall is a 31 y.o. female presenting to clinic today for same day appointment. PCP: Wendee Beaversavid J McMullen, DO Concerns today include:  Boil:  - reports of boil on her left buttock since this Tuesday that slowly got bigger and more painful that it is difficult to sit or stand. It did start to drain about 1 hour before her appointment. Reports initially the area "felt hot" - reports of fever to 101 on Tuesday but has been afebrile since then. She does not have URI symptoms and has not been ill in any other way. No chills. Normal PO intake - reports of a boil that is similar "years ago" (> 5 Years ago)  - reports that she sometimes gets boils in her underarms which self resolve - reports that her sister has hydradenitis supprativa  ROS: See HPI.  PMFSH:  Eczema  PHYSICAL EXAM: BP 108/72 (BP Location: Left Arm, Patient Position: Standing, Cuff Size: Large) Comment (Patient Position): unable to sit due to boil on buttock is painful (jar)  Pulse 62   Temp 98.5 F (36.9 C) (Oral)   Ht 5\' 7"  (1.702 m)   Wt 208 lb 6.4 oz (94.5 kg)   LMP 10/11/2016   BMI 32.64 kg/m  GEN: NAD SKIN: Right Buttock: 6.5cm x 6.5cm induration with no erythema or fluctuance but tender to palpation. At the center about 0.5cm opening where the abscess drained. Unable to express any drainage from the area.   PSYCH: Mood and affect euthymic, normal rate and volume of speech NEURO: Awake, alert, no focal deficits grossly, normal speech   ASSESSMENT/PLAN: Cutaneous abscess of buttock No area of fluctuance. Already has a site where it drained but unable to express any discharge from the area. No overlying erythema but due to the large area of induration, will treat with antibiotics. Doxycycline 100mg  BID x 10 days. Warm compresses TID. Return precautions discussed.   Palma HolterKanishka G Laray Corbit, MD PGY 2 [facility]

## 2016-11-03 ENCOUNTER — Encounter: Payer: Self-pay | Admitting: Internal Medicine

## 2016-11-03 ENCOUNTER — Ambulatory Visit (INDEPENDENT_AMBULATORY_CARE_PROVIDER_SITE_OTHER): Payer: BC Managed Care – PPO | Admitting: Internal Medicine

## 2016-11-03 VITALS — BP 108/72 | HR 62 | Temp 98.5°F | Ht 67.0 in | Wt 208.4 lb

## 2016-11-03 DIAGNOSIS — L0231 Cutaneous abscess of buttock: Secondary | ICD-10-CM | POA: Diagnosis not present

## 2016-11-03 MED ORDER — DOXYCYCLINE HYCLATE 100 MG PO TABS: 100.0000 mg | ORAL_TABLET | Freq: Two times a day (BID) | ORAL | 0 refills | Status: DC

## 2016-11-03 NOTE — Patient Instructions (Signed)
Please take doxycycline 1 tablet twice a day for 10 days Please do warm compresses three times a day  Return to clinic if symptoms worsen or do not improve with antibiotic.

## 2016-12-05 ENCOUNTER — Encounter: Payer: Self-pay | Admitting: Internal Medicine

## 2016-12-05 ENCOUNTER — Ambulatory Visit (INDEPENDENT_AMBULATORY_CARE_PROVIDER_SITE_OTHER): Payer: BC Managed Care – PPO | Admitting: Internal Medicine

## 2016-12-05 ENCOUNTER — Encounter: Payer: Self-pay | Admitting: Nurse Practitioner

## 2016-12-05 VITALS — BP 126/82 | HR 73 | Temp 98.1°F | Ht 67.0 in | Wt 212.2 lb

## 2016-12-05 DIAGNOSIS — R1310 Dysphagia, unspecified: Secondary | ICD-10-CM | POA: Diagnosis not present

## 2016-12-05 MED ORDER — OMEPRAZOLE 20 MG PO CPDR: 20.0000 mg | DELAYED_RELEASE_CAPSULE | Freq: Every day | ORAL | 3 refills | Status: DC

## 2016-12-05 NOTE — Assessment & Plan Note (Signed)
Concerning that symptoms are progressing so quickly. No weight loss or neck masses appreciated. Throat very erythematous but no other abnormal physical exam findings. Breathing comfortably on RA with lungs CTAB. Patient is former smoker (quit 5 years ago), though would be unusual to see effects of this at her age. Given worsening symptoms, will place urgent GI referral .  - Urgent GI referral placed. Suspect patient may need endoscopy for further evaluation.  - Begin omeprazole 20mg  qd in the meantime

## 2016-12-05 NOTE — Progress Notes (Signed)
   Subjective:   Patient: Jane Marshall       Birthdate: 1986/06/05       MRN: [id number]      HPI  Jane Marshall is a 31 y.o. female presenting for same day appointment for difficulty swallowing.   Dysphagia Began two months ago. Patient feels as if her throat is "slowly but surely closing in." Says throat always feels dry and she often wakes up in the middle of the night very thirsty and feeling as if she must drink water. Also noticed hoarseness. She was previously evaluated for this and told to eat honey, however this has not been helpful. Patient is having most difficulty swallowing solids but is now having difficulty with liquids as well. Symptoms have been progressively worsening over past two months, but abruptly worsened two days ago. Since that time, patient has been regurgitating yellow fluid a few hours after eating. Patient says she feel the need to cough then regurgitates fluid. It also occurred this morning prior to appointment even though patient has not eaten today. She is not coughing otherwise. Denies fevers, weight loss. Denies sore throat, though says she does have some throat irritation, primarily after regurgitating. Denies nausea. Feels as if something is stuck in her throat. Denies any burning or epigastric pain. Denies noticing any neck masses.  Patient only takes Duexis (ibuprofen/famotidine) which she says was prescribed for plantar fasciitis in mid February, as well as a nasal spray.   Smoking status reviewed. Patient is former smoker.   Review of Systems See HPI.     Objective:  Physical Exam  Constitutional: She is oriented to person, place, and time and well-developed, well-nourished, and in no distress.  HENT:  Oropharyngeal erythema. No exudates. MMM.   Eyes: Conjunctivae and EOM are normal. Right eye exhibits no discharge. Left eye exhibits no discharge.  Neck: Normal range of motion. Neck supple. No tracheal deviation present. No thyromegaly present.  No  masses appreciated.   Pulmonary/Chest: Effort normal and breath sounds normal. No respiratory distress. She has no wheezes.  Coughing occasionally throughout encounter  Lymphadenopathy:    She has no cervical adenopathy.  Neurological: She is alert and oriented to person, place, and time.  Skin: Skin is warm and dry.  Psychiatric: Affect and judgment normal.      Assessment & Plan:  Dysphagia Concerning that symptoms are progressing so quickly. No weight loss or neck masses appreciated. Throat very erythematous but no other abnormal physical exam findings. Breathing comfortably on RA with lungs CTAB. Patient is former smoker (quit 5 years ago), though would be unusual to see effects of this at her age. Given worsening symptoms, will place urgent GI referral .  - Urgent GI referral placed. Suspect patient may need endoscopy for further evaluation.  - Begin omeprazole 20mg  qd in the meantime - Precepted with Dr. Rejeana BrockWalden   Jane J Lancaster, MD, MPH PGY-2 [facility] Pager [phone]

## 2016-12-05 NOTE — Patient Instructions (Addendum)
It was nice meeting you today Ms. Jane Marshall!  Please begin taking omeprazole one tablet (20mg ) every day.   I have placed a referral to GI (gastroenterology) for you. They will call you with the date and time of this appointment.   If you have any questions or concerns, please feel free to call the clinic.   Be well,  Dr. Natale MilchLancaster

## 2016-12-15 ENCOUNTER — Encounter (INDEPENDENT_AMBULATORY_CARE_PROVIDER_SITE_OTHER): Payer: Self-pay

## 2016-12-15 ENCOUNTER — Ambulatory Visit (INDEPENDENT_AMBULATORY_CARE_PROVIDER_SITE_OTHER): Payer: BC Managed Care – PPO | Admitting: Nurse Practitioner

## 2016-12-15 ENCOUNTER — Encounter: Payer: Self-pay | Admitting: Nurse Practitioner

## 2016-12-15 DIAGNOSIS — K219 Gastro-esophageal reflux disease without esophagitis: Secondary | ICD-10-CM | POA: Diagnosis not present

## 2016-12-15 MED ORDER — OMEPRAZOLE 20 MG PO CPDR: 20.0000 mg | DELAYED_RELEASE_CAPSULE | Freq: Two times a day (BID) | ORAL | 3 refills | Status: DC

## 2016-12-15 NOTE — Patient Instructions (Addendum)
We have given you a pamphlet on Gastroesophageal Reflux Disease (GERD). Increase your prilosec to  twice a day. Call in 2 weeks with an update.

## 2016-12-17 NOTE — Progress Notes (Addendum)
HPI:  Jane Marshall is a 31 year old female referred by PCP Dr. Brayton Caves for with globus, cough, vomiting and bloating. She complaints of frequent need to clear throat but nothing comes up. No sinus drainage. She frequently coughs which triggers episode of vomiting of yellow liquid. Coughing occurs after eating or sometimes in the am before breakfast. No odynophagia but admits to some solid food dysphagia. .  She has had heartburn for years, especially after breakfast. No treatment for GERD symptoms until starting Prilosec two weeks ago. She took a course of doxycycline several weeks ago but no problems swallowing or pain with swallowing followed the antibiotics. She is feeling better on prilosec. Marland Kitchen She frequently bloats after eating. Her last period was several months ago. Not pregnant, she is bisexual but has been with only a female for last 6 years.     Past Medical History:  Diagnosis Date  . Exotropia of left eye   . H. pylori infection   . Headache(784.0)     No past surgical history on file. Family History  Problem Relation Age of Onset  . Early death Mother   . Heart disease Mother   . Kidney disease Mother   . Cancer Father     pancreatic  . Crohn's disease Sister    Social History  Substance Use Topics  . Smoking status: Former Smoker    Packs/day: 0.25    Types: Cigarettes, Cigars    Quit date: 09/12/2011  . Smokeless tobacco: Never Used  . Alcohol use 8.4 oz/week    14 Cans of beer per week     Comment: Binges on Weekend. Endorses sometimes needing "eye opener."   Current Outpatient Prescriptions  Medication Sig Dispense Refill  . doxycycline (VIBRA-TABS) 100 MG tablet Take 1 tablet (100 mg total) by mouth 2 (two) times daily. 20 tablet 0  . Ibuprofen-Famotidine (DUEXIS) 800-26.6 MG TABS Take by mouth.    Marland Kitchen omeprazole (PRILOSEC) 20 MG capsule Take 1 capsule (20 mg total) by mouth 2 (two) times daily. 60 capsule 3   No current facility-administered  medications for this visit.    No Known Allergies   Review of Systems: Positive for allergies, cough, headaches, itching, night sweats, skin rash, sleeping problems, swelling of feet, excessive thirst. All other systems reviewed and negative except where noted in HPI.    Physical Exam: BP 104/74   Pulse 82   Ht  (1.702 m)   Wt 212 lb 12.8 oz (96.5 kg)   LMP 10/11/2016 Comment: no chance of being pregnant  SpO2 99%   BMI 33.33 kg/m  Constitutional:  Well-developed, black female in no acute distress. Psychiatric: Normal mood and affect. Behavior is normal. HEENT: Normocephalic and atraumatic. Conjunctivae are normal. No scleral icterus. Neck supple.  Cardiovascular: Normal rate, regular rhythm.  Pulmonary/chest: Effort normal and breath sounds normal. No wheezing, rales or rhonchi. Abdominal: Soft, nondistended, nontender. Bowel sounds active throughout. There are no masses palpable. No hepatomegaly. Extremities: no edema Lymphadenopathy: No cervical adenopathy noted. Neurological: Alert and oriented to person place and time. Skin: Skin is warm and dry. No rashes noted.   ASSESSMENT AND PLAN:   31 yo female with GERD symptoms including globus, pyrosis, cough. Some solid food dysphgaia as well. Symptoms improving after starting prilosec a couple of weeks ago.  -GERD literature given, anti-reflux measures discussed. I would like to give her more time on a daily PPI since esophagitis could be contributing to dysphagia.Marland Kitchen  Certainly if she doesn't continue to improve then we will proceed with further workup including EGD. Patient will call with a condition update in a couple of weeks, or sooner if she regresses.    Willette Cluster, NP  12/17/2016, 11:29 PM  Cc:  Mathis Bud* MD  Agree with Ms. Javia Dillow's assessment and plan. Iva Boop, MD, [facility]

## 2016-12-25 ENCOUNTER — Encounter: Payer: Self-pay | Admitting: Family Medicine

## 2016-12-26 ENCOUNTER — Other Ambulatory Visit: Payer: Self-pay | Admitting: Family Medicine

## 2016-12-27 ENCOUNTER — Encounter: Payer: Self-pay | Admitting: Student

## 2016-12-27 ENCOUNTER — Ambulatory Visit (INDEPENDENT_AMBULATORY_CARE_PROVIDER_SITE_OTHER): Payer: BC Managed Care – PPO | Admitting: Student

## 2016-12-27 ENCOUNTER — Telehealth: Payer: Self-pay

## 2016-12-27 ENCOUNTER — Other Ambulatory Visit (HOSPITAL_COMMUNITY)
Admission: RE | Admit: 2016-12-27 | Discharge: 2016-12-27 | Disposition: A | Discharge status: Home / Self Care | Payer: BC Managed Care – PPO | Source: Ambulatory Visit | Attending: Family Medicine | Admitting: Family Medicine

## 2016-12-27 VITALS — BP 122/80 | HR 62 | Temp 98.4°F | Wt 208.0 lb

## 2016-12-27 DIAGNOSIS — N912 Amenorrhea, unspecified: Secondary | ICD-10-CM | POA: Insufficient documentation

## 2016-12-27 DIAGNOSIS — R5383 Other fatigue: Secondary | ICD-10-CM | POA: Insufficient documentation

## 2016-12-27 DIAGNOSIS — Z7251 High risk heterosexual behavior: Secondary | ICD-10-CM | POA: Insufficient documentation

## 2016-12-27 DIAGNOSIS — R1310 Dysphagia, unspecified: Secondary | ICD-10-CM | POA: Diagnosis not present

## 2016-12-27 DIAGNOSIS — N898 Other specified noninflammatory disorders of vagina: Secondary | ICD-10-CM | POA: Insufficient documentation

## 2016-12-27 LAB — POCT URINE PREGNANCY: Preg Test, Ur: NEGATIVE

## 2016-12-27 LAB — POCT WET PREP (WET MOUNT)
CLUE CELLS WET PREP WHIFF POC: NEGATIVE
TRICHOMONAS WET PREP HPF POC: ABSENT

## 2016-12-27 MED ORDER — METRONIDAZOLE 500 MG PO TABS: 500.0000 mg | ORAL_TABLET | Freq: Two times a day (BID) | ORAL | 0 refills | Status: DC

## 2016-12-27 NOTE — Patient Instructions (Signed)
Follow up in 1 week You will be called regarding your testing results Please use protection when you are sexually active DO NOT shave your vaginal area Call the office with questions or concerns

## 2016-12-27 NOTE — Telephone Encounter (Signed)
Pt called back and I informed her of BV diagnosis and rx to pharmacy. Instructions given, pt voiced understanding.

## 2016-12-27 NOTE — Assessment & Plan Note (Addendum)
GC/CT, wet prep. Wet prep showed BV, will treat

## 2016-12-27 NOTE — Assessment & Plan Note (Signed)
TSH today, weill follow

## 2016-12-27 NOTE — Telephone Encounter (Signed)
Attempted to contact pt to inform her of positive BV infection and antibiotic sent to pharmacy. However, no answer, VM was left to call the office.

## 2016-12-27 NOTE — Assessment & Plan Note (Signed)
Secondary amenorrhea of unclear cause. Upreg negative. Patient is obese, so possibly she is having first manifestation of PCOS - TSH, Prolactin, FSH, E2 today - follow in one week

## 2016-12-27 NOTE — Progress Notes (Signed)
   Subjective:    Patient ID: Jane Marshall, female    DOB: 09/26/1985, 31 y.o.   MRN: [id number]   CC: amemnorhea  HPI: 31 y/o F presents for amenorrhea   Amenorrhea - last period was in January - she is sexually active with women only, but does not use condoms - prior to January she had regular menses that lasted about 1 week - she last had intercourse with a man 6 years ago\ - no headaches, no changes in vision  Fatigue - she reports increased fatigue during this time  Dysphagia - she reports some feeling that her food gets caught in her throat and has a dry throat during this time as well - she has been seen by GI for this and started on medication with the plan for EGD  Smoking status reviewed  Review of Systems  Per HPI, else denies recent illness, fever, chest pain, shortness of breath    Objective:  BP 122/80   Pulse 62   Temp 98.4 F (36.9 C) (Oral)   Wt 208 lb (94.3 kg)   LMP 10/11/2016   SpO2 99%   BMI 32.58 kg/m  Vitals and nursing note reviewed  General: NAD Cardiac: RRR, HEENT: normal vision with no vision defects, normal thyroid without enlargement or palpable nodules Respiratory: CTAB, normal effort Abdomen: obese, soft, nontender,  Skin: warm and dry, no rashes noted Neuro: alert and oriented, no focal deficits  Pelvic: Normal EGBUS, normal vaginal canal, normal cervix with no CMT, normal mobile uterus, normal adnexa with no masses, no adnexal tenderness     Assessment & Plan:    Amenorrhea Secondary amenorrhea of unclear cause. Upreg negative. Patient is obese, so possibly she is having first manifestation of PCOS - TSH, Prolactin, FSH, E2 today - follow in one week   Fatigue TSH today, weill follow  Dysphagia No palpable masses or thyroid enlargement noted, TSH, prolactin ordered, will follow. GI is also following, will follow along   High risk sexual behavior GC/CT, wet prep. Wet prep showed BV, will treat    Jane Marshall A.  Kennon Rounds MD, MS Family Medicine Resident PGY-3 Pager [phone]

## 2016-12-27 NOTE — Assessment & Plan Note (Signed)
No palpable masses or thyroid enlargement noted, TSH, prolactin ordered, will follow. GI is also following, will follow along

## 2016-12-28 LAB — CERVICOVAGINAL ANCILLARY ONLY
Chlamydia: NEGATIVE
NEISSERIA GONORRHEA: NEGATIVE

## 2016-12-28 LAB — FOLLICLE STIMULATING HORMONE: FSH: 3.6 m[IU]/mL

## 2016-12-28 LAB — PROLACTIN: Prolactin: 33.7 ng/mL — ABNORMAL HIGH (ref 4.8–23.3)

## 2016-12-28 LAB — TSH: TSH: 2.21 u[IU]/mL (ref 0.450–4.500)

## 2016-12-28 LAB — ESTRADIOL: Estradiol: 50.5 pg/mL

## 2017-01-01 ENCOUNTER — Telehealth: Payer: Self-pay

## 2017-01-01 ENCOUNTER — Telehealth: Payer: Self-pay | Admitting: Nurse Practitioner

## 2017-01-01 NOTE — Telephone Encounter (Signed)
Cleone M. Lao [phone]  Holli Humbles 2 hours ago (11:42 AM)   Incoming call:  Pt said she is not feeling better and wants to know if she can schedule an Endo

## 2017-01-01 NOTE — Telephone Encounter (Signed)
Left a message to call back. I can help her get her egd scheduled. What are her GI symptoms at this time?

## 2017-01-01 NOTE — Telephone Encounter (Signed)
Called to discuss labs. She did not answer but a message was left asking her to make an appointment with her PCP to discuss her results. She does have mild hyperprolactinemia which is likely contributing to her amenorrhea. Her FSh and Bing Quarry are normal making primary or secondary hypogonadism unlikely. TSH was also normal.  Will need to discuss MRI to evaluate prolactin secreting pituitary tumor when she is next seen

## 2017-01-01 NOTE — Telephone Encounter (Signed)
Pt was advised. Appointment scheduled for 01-02-17. ep

## 2017-01-01 NOTE — Telephone Encounter (Deleted)
Called back to her and got her voicemail. Left a message asking for her GI symptoms presently.

## 2017-01-02 ENCOUNTER — Encounter: Payer: Self-pay | Admitting: Family Medicine

## 2017-01-02 ENCOUNTER — Ambulatory Visit (INDEPENDENT_AMBULATORY_CARE_PROVIDER_SITE_OTHER): Payer: BC Managed Care – PPO | Admitting: Family Medicine

## 2017-01-02 DIAGNOSIS — R7989 Other specified abnormal findings of blood chemistry: Secondary | ICD-10-CM | POA: Insufficient documentation

## 2017-01-02 DIAGNOSIS — E229 Hyperfunction of pituitary gland, unspecified: Secondary | ICD-10-CM

## 2017-01-02 NOTE — Patient Instructions (Signed)
Nice to meet you today. Your prolactin level was slightly elevated on your labs last week. Not taking any medicines that would give you this problem. You do not have any symptoms that are worrisome with this. We will plan to recheck in 3 weeks. Please schedule a lab only appointment for this. If you develop any vision loss or change in your headaches, please let us know.  Take care, Dr. Leonard Schwartz

## 2017-01-02 NOTE — Progress Notes (Signed)
   Subjective:   Jane Marshall is a 31 y.o. female with a history of GERD, bacterial vaginosis here for amenorrhea follow-up  Was seen 12/27/16 for amenorrhea x3 months Prior to 09/2016, she had regular menses occurring every month that lasted for about a week at a time Menarche at age 20 Estradiol, FSH, TSH wnl. UPreg neg. Prolactin found to be mildly elevated to 33 (ULN 23, though appears ULN can be up to 30 for pre-menopausal women) She denies any vision loss, Galactorrhea, history of renal disease She has intermittent sinus and tension headaches that have not changed in many years and are relieved by ibuprofen Does not take any antipsychotics or antidepressants or Reglan  Review of Systems:  Per HPI.   Social History: former smoker  Objective:  BP 118/70   Pulse 77   Temp 99 F (37.2 C) (Oral)   Ht  (1.702 m)   Wt 206 lb 6.4 oz (93.6 kg)   SpO2 98%   BMI 32.33 kg/m   Gen:  31 y.o. female in NAD HEENT: NCAT, MMM, EOMI, PERRL, anicteric sclerae, visual fields intact NEck: Supple, no LAD, no thyromegaly CV: RRR, no MRG Resp: Non-labored, CTAB, no wheezes noted Ext: WWP, no edema MSK: No obvious deformities, gait intact Neuro: Alert and oriented, speech normal, radial nerves intact, strength intact, sensation intact to light touch       Chemistry      Component Value Date/Time   NA 139 07/21/2008 1901   K 4.0 07/21/2008 1901   CL 102 07/21/2008 1901   BUN 8 07/21/2008 1901   CREATININE 1.2 07/21/2008 1901   No results found for: CALCIUM, ALKPHOS, AST, ALT, BILITOT    Lab Results  Component Value Date   HGB 15.3 (H) 07/21/2008   HCT 45.0 07/21/2008   Lab Results  Component Value Date   TSH 2.210 12/27/2016   No results found for: HGBA1C Assessment & Plan:     [facility] Jane Marshall is a 31 y.o. female here for   Elevated prolactin level (HCC) Prolactin slightly elevated on check 1 week ago No red flag symptoms concerning for prolactinoma Not on any  medications that cause hyperprolactinemia Could be elevated due to stress or possibly due to breast stimulation We will plan to recheck one month after her last check If continues to be elevated, we will discuss need for MRI with contrast to evaluate for prolactinoma Return precautions discussed   Erasmo Downer, MD MPH PGY-3,  [facility] 01/02/2017  3:21 PM

## 2017-01-02 NOTE — Assessment & Plan Note (Signed)
Prolactin slightly elevated on check 1 week ago No red flag symptoms concerning for prolactinoma Not on any medications that cause hyperprolactinemia Could be elevated due to stress or possibly due to breast stimulation We will plan to recheck one month after her last check If continues to be elevated, we will discuss need for MRI with contrast to evaluate for prolactinoma Return precautions discussed

## 2017-01-14 ENCOUNTER — Encounter: Payer: Self-pay | Admitting: Family Medicine

## 2017-01-18 ENCOUNTER — Ambulatory Visit (INDEPENDENT_AMBULATORY_CARE_PROVIDER_SITE_OTHER): Payer: BC Managed Care – PPO | Admitting: Family Medicine

## 2017-01-18 ENCOUNTER — Encounter: Payer: Self-pay | Admitting: Family Medicine

## 2017-01-18 VITALS — BP 108/66 | Temp 97.8°F | Ht 67.0 in | Wt 208.6 lb

## 2017-01-18 DIAGNOSIS — L301 Dyshidrosis [pompholyx]: Secondary | ICD-10-CM

## 2017-01-18 DIAGNOSIS — R21 Rash and other nonspecific skin eruption: Secondary | ICD-10-CM

## 2017-01-18 LAB — POCT SKIN KOH: Skin KOH, POC: NEGATIVE

## 2017-01-18 MED ORDER — HYDROCORTISONE 1 % EX OINT
1.0000 | TOPICAL_OINTMENT | Freq: Two times a day (BID) | CUTANEOUS | 0 refills | Status: DC
Start: 2017-01-18 — End: 2019-04-30

## 2017-01-18 NOTE — Progress Notes (Signed)
   Subjective:    Patient ID: Jane Marshall , female   DOB: 1986/06/15 , 31 y.o..   MRN: [id number]  HPI  Jane Marshall is here for a same day visit for  Chief Complaint  Patient presents with  . Rash    1. RASH  Location: hands and face. Had some bumps on her hands and face, they are pruitic  Onset: 5 days ago is when she noticed it    Course: Bumps started on hands and face about 5 days ago. 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 Notes that she washes her hands often while cleaning.   Red Flags Feeling ill: no  Fever: no  Mouth lesions: no  Facial/tongue swelling/difficulty breathing:  no  Diabetic or immunocompromised: no   Review of Systems: Per HPI.   Past Medical History: Patient Active Problem List   Diagnosis Date Noted  . 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 Elevated prolactin level (HCC) 01/02/2017  . Amenorrhea 12/27/2016  . Fatigue 12/27/2016  . High risk sexual behavior 12/27/2016  . GERD (gastroesophageal reflux disease) 12/15/2016  . Dysphagia 12/05/2016  . Chronic pain of right heel 10/26/2016  . Eczema 10/16/2014  . Hallux, valgus, congenital 10/15/2014   Social Hx:  reports that she quit smoking about 5 years ago. Her smoking use included Cigarettes and Cigars. She smoked 0.25 packs per day. She has never used smokeless tobacco.   Objective:   BP 108/66   Temp 97.8 F (36.6 C) (Oral)   Ht 5\' 7"  (1.702 m)   Wt 208 lb 9.6 oz (94.6 kg)   LMP 12/29/2016   SpO2 99%   BMI 32.67 kg/m  Physical Exam  Gen: NAD, alert, cooperative with exam, well-appearing Skin: Tiny ~1 mm  scattered erythematous papules mostly on fingers and thenar prominence of bilateral hands and face Psych: good insight, normal mood and affect  Assessment & Plan:  Dyshidrotic eczema Exam and history most consistent with this. Mostly on hands and some spots on face, however facial lesions appear more like acne KOH negative, less likely a fungal process.  - Will treat with 1% Hydrocortisone ointment  - Return to clinic in 1 week if no improvement - Could also consider scabies as a differential if rash doesn't resolve  Orders Placed This Encounter  Procedures  . POCT Skin KOH   Meds ordered this encounter  Medications  . hydrocortisone 1 % ointment    Sig: Apply 1 application topically 2 (two) times daily.    Dispense:  30 g    Refill:  0    Anders Simmondshristina Kiauna Zywicki, MD [facility], PGY-2

## 2017-01-18 NOTE — Patient Instructions (Addendum)
Thank you for coming in today, it was so nice to see you! Today we talked about:    Rash: I think this is likely an eczema type rash. The treatment for this is steroid cream that apply twice daily. I have sent in a prescription for 1% Hydrocortisone steroid cream to your pharmacy.   Please follow up in 1 week if your rash doesn't go away or gets worse.    Hand Dermatitis Hand dermatitis is a skin condition. It causes small, itchy, raised dots or fluid-filled blisters to form on the palms of the hands. This condition may also be called hand eczema. Follow these instructions at home:  Take or apply over-the-counter and prescription medicines only as told by your doctor.  If you were prescribed an antibiotic medicine, use it as told by your doctor. Do not stop using the antibiotic even if you start to feel better.  Avoid washing your hands more often than you need to.  Avoid using harsh chemicals on your hands.  Wear gloves that protect your hands when you handle products that can bother (irritate) your skin.  Keep all follow-up visits as told by your doctor. This is important. Contact a doctor if:  Your rash is not better after one week of treatment.  Your rash is red.  Your rash is tender.  Your rash has pus coming from it.  Your rash spreads. This information is not intended to replace advice given to you by your health care provider. Make sure you discuss any questions you have with your health care provider. Document Released: 11/22/2009 Document Revised: 02/03/2016 Document Reviewed: 03/12/2015 Elsevier Interactive Patient Education  2017 [employer]  If you have any questions or concerns, please do not hesitate to call the office at [phone]. You can also message me directly via MyChart.   Sincerely,  Anders Simmondshristina Nikyla Navedo, MD

## 2017-01-19 DIAGNOSIS — L301 Dyshidrosis [pompholyx]: Secondary | ICD-10-CM | POA: Insufficient documentation

## 2017-01-19 NOTE — Assessment & Plan Note (Signed)
Exam and history most consistent with this. Mostly on hands and some spots on face, however facial lesions appear more like acne KOH negative, less likely a fungal process.  - Will treat with 1% Hydrocortisone ointment  - Return to clinic in 1 week if no improvement - Could also consider scabies as a differential if rash doesn't resolve

## 2017-01-24 ENCOUNTER — Encounter: Payer: Self-pay | Admitting: Internal Medicine

## 2017-02-01 ENCOUNTER — Encounter: Payer: Self-pay | Admitting: Internal Medicine

## 2017-02-01 ENCOUNTER — Ambulatory Visit (AMBULATORY_SURGERY_CENTER): Payer: Self-pay | Admitting: *Deleted

## 2017-02-01 VITALS — Ht 67.0 in | Wt 207.0 lb

## 2017-02-01 DIAGNOSIS — R1310 Dysphagia, unspecified: Secondary | ICD-10-CM

## 2017-02-01 NOTE — Progress Notes (Signed)
No egg or soy allergy known to patient  No issues with past sedation with any surgeries  or procedures, no intubation problems  No diet pills per patient No home 02 use per patient  No blood thinners per patient  Pt denies chronic issues with constipation just occasionally  No A fib or A flutter  EMMI video sent to pt's e mail

## 2017-02-06 NOTE — Progress Notes (Signed)
Subjective:   Patient ID: Jane Marshall    DOB: 05-28-1986, 31 y.o. female   MRN: [id number]  CC: "Foot pain"  HPI: Jane Marshall is a 31 y.o. female who presents to clinic today for foot pain. Problems discussed today are as follows:  Right foot pain: Patient has history of congenital hallux valgus formerly followed by Jane Marshall with orthopedics. Has a history of surgical fixation at a young age. Was seen by orthopedics a few months ago with recommendations for conservative management given cortisone injection with some improvement over the course of one month. 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 Tries ibuprofen 200 mg daily with some relief. Wears tennis shoes with Jane Marshall inserts. ROS: Denies erythema or swelling of the foot, claudication, decrease in sensory or motor use.  Vaginal itching: Patient recently treated for BV on 12/2016. Discharge has resolved but itching persists. Denies possibility of being pregnant. ROS: Denies vaginal discharge, fevers or chills, abdominal pain, nausea or vomiting.  Complete ROS performed, see HPI for pertinent.  PMFSH: Congenital hallux valgus, FHx father pancreatic cancer and mother heart and kidney disease. Smoking status reviewed. Medications reviewed.  Objective:   BP 104/66   Pulse 62   Temp 98.3 F (36.8 C) (Oral)   Ht 5\' 7"  (1.702 m)   Wt 211 lb 3.2 oz (95.8 kg)   LMP 02/01/2017 (Exact Date)   SpO2 100%   BMI 33.08 kg/m  Vitals and nursing note reviewed.  General: well nourished, well developed, in no acute distress with non-toxic appearance HEENT: normocephalic, atraumatic, moist mucous membranes CV: regular rate and rhythm without murmurs, rubs, or gallops, no lower extremity edema Lungs: clear to auscultation bilaterally with normal work of breathing Abdomen: soft, non-tender, non-distended, normoactive bowel sounds Skin: warm, dry, no rashes or  lesions, cap refill < 2 seconds Extremities: warm and well perfused, normal tone, moderately tender to palpation at heel on right foot without erythema or edema present, hallux valgus appreciated, right ankle with full range of motion both passive and active  Assessment & Plan:   Chronic pain of right heel Acute on chronic. Localized to right heel without objective findings for infection. No trauma or entry wound.  C/w plantar fascitis given location and risk factor with congenital hallux valgus. Improved with steroid injection. --Continue conservative measures including ice after activity, message prior to getting out of bed, ergonomics --Amb referral to sports med for orthotic evaluation --Given Rx for Nitrodur 0.2 mg/hr patch --RTC if symptoms worsen over the course of the month  Vaginal pruritus Acute. Likely candidal by hx given recent abx use for BV last month. No vaginal discharge or bleeding by hx. Less likely persistent BV. --Will empirically give single dose of Fluconazole 150 mg --Holding on further testing and examination unless symptoms persist --Patient to RTC if symptoms do not improve over the week  Orders Placed This Encounter  Procedures  . Ambulatory referral to Sports Medicine    Referral Priority:   Routine    Referral Type:   Consultation    Number of Visits Requested:   1   Meds ordered this encounter  Medications  . fluconazole (DIFLUCAN) 150 MG tablet    Sig: Take 1 tablet (150 mg total) by mouth once.    Dispense:  1 tablet    Refill:  0  . nitroGLYCERIN (NITRODUR - DOSED IN MG/24 HR)  0.2 mg/hr patch    Sig: Place 1 patch (0.2 mg total) onto the skin daily.    Dispense:  30 patch    Refill:  12    Jane Parcelavid McMullen, DO [facility], PGY-1 02/07/2017 7:34 PM

## 2017-02-07 ENCOUNTER — Encounter: Payer: Self-pay | Admitting: Family Medicine

## 2017-02-07 ENCOUNTER — Other Ambulatory Visit: Payer: BC Managed Care – PPO

## 2017-02-07 ENCOUNTER — Ambulatory Visit (INDEPENDENT_AMBULATORY_CARE_PROVIDER_SITE_OTHER): Payer: BC Managed Care – PPO | Admitting: Family Medicine

## 2017-02-07 VITALS — BP 104/66 | HR 62 | Temp 98.3°F | Ht 67.0 in | Wt 211.2 lb

## 2017-02-07 DIAGNOSIS — G8929 Other chronic pain: Secondary | ICD-10-CM | POA: Diagnosis not present

## 2017-02-07 DIAGNOSIS — N898 Other specified noninflammatory disorders of vagina: Secondary | ICD-10-CM | POA: Insufficient documentation

## 2017-02-07 DIAGNOSIS — E229 Hyperfunction of pituitary gland, unspecified: Secondary | ICD-10-CM | POA: Diagnosis not present

## 2017-02-07 DIAGNOSIS — R7989 Other specified abnormal findings of blood chemistry: Secondary | ICD-10-CM

## 2017-02-07 DIAGNOSIS — M79671 Pain in right foot: Secondary | ICD-10-CM | POA: Diagnosis not present

## 2017-02-07 DIAGNOSIS — L298 Other pruritus: Secondary | ICD-10-CM

## 2017-02-07 MED ORDER — FLUCONAZOLE 150 MG PO TABS: 150.0000 mg | ORAL_TABLET | Freq: Once | ORAL | 0 refills | Status: AC

## 2017-02-07 MED ORDER — NITROGLYCERIN 0.2 MG/HR TD PT24: 0.2000 mg | MEDICATED_PATCH | Freq: Every day | TRANSDERMAL | 12 refills | Status: DC

## 2017-02-07 NOTE — Patient Instructions (Addendum)
Thank you for coming in to see Jane Marshall today. Please see below to review our plan for today's visit.  1. I have placed a referral for sports medicine to evaluate for right heel pain for orthotic insertions. In the meantime, please continue using conservative measures with ice after activity, massage prior to getting out of bed. It often takes several years for plantar fasciitis to improve. I have also given you a prescription for a nitroglycerin patch. Please cut this and placed one fourth of the patch on the affected area every 24 hours. He should apply this prior to bedtime and wash her hands after applying. Common side effects include headache which usually resolve in 24-48 hours. If this persists longer than 2 days, please discontinue.  2. I have given you a prescription for a single dose of fluconazole for your itching. Taking this should help resolve her symptoms. If it persists, return to the clinic for further evaluation. 3. Return to the clinic in 1 year or sooner if needed.  Please call the clinic at [phone] if your symptoms worsen or you have any concerns. It was my pleasure to see you. -- Durward Parcel, DO [facility], PGY-1  Nitroglycerin skin patches What is this medicine? NITROGLYCERIN (nye troe GLI ser in) is a type of vasodilator. It relaxes blood vessels, increasing the blood and oxygen supply to your heart. This medicine is used to prevent chest pain caused by angina. It will not help to stop an episode of chest pain. This medicine may be used for other purposes; ask your health care provider or pharmacist if you have questions. COMMON BRAND NAME(S): Deponit, Minitran, Nitrek, Nitro-Dur, Nitrodisc, Transdermal-NTG What should I tell my health care provider before I take this medicine? They need to know if you have any of these conditions: -liver disease -low blood pressure, or low blood volume -previous heart attack or heart failure -an unusual or allergic  reaction to nitroglycerin, adhesives, other medicines, foods, dyes, or preservatives -pregnant or trying to get pregnant -breast-feeding How should I use this medicine? This medicine is for external use only. Follow the directions on the prescription label. One patch contains a full day's supply of medicine. It is usually worn for 12 to 14 hours a day and removed for 10 to 12 hours. Apply the patch to an area on the upper body that is clean, dry and hairless. Avoid injured, irritated, calloused, or scarred areas. Use a different site each day to prevent skin irritation. Do not cut or trim the patch. Do not use your medicine more often than directed. Do not stop using this medicine suddenly or your symptoms may get worse. Ask your doctor or health care professional how to gradually reduce the dose. Talk to your pediatrician regarding the use of this medicine in children. Special care may be needed. Overdosage: If you think you have taken too much of this medicine contact a poison control center or emergency room at once. NOTE: This medicine is only for you. Do not share this medicine with others. What if I miss a dose? If you miss a dose, apply the patch as soon as you can. Do not wear two patches at the same time unless told to by your doctor or health care professional. What may interact with this medicine? Do not take this medicine with any of the following medications: -certain migraine medicines like ergotamine and dihydroergotamine (DHE) -medicines used to treat erectile dysfunction like sildenafil, tadalafil, and vardenafil -riociguat  This medicine may also interact with the following medications: -medicines for high blood pressure -other medicines used to treat angina This list may not describe all possible interactions. Give your health care provider a list of all the medicines, herbs, non-prescription drugs, or dietary supplements you use. Also tell them if you smoke, drink alcohol, or use  illegal drugs. Some items may interact with your medicine. What should I watch for while using this medicine? Check your heart rate and blood pressure regularly while you are using this medicine. Ask your doctor or health care professional what your heart rate and blood pressure should be and when you should contact him or her. Tell your doctor or health care professional if you feel your medicine is no longer having any effect. You may get drowsy or dizzy. Do not drive, use machinery, or do anything that needs mental alertness until you know how this drug affects you. Do not stand or sit up quickly, especially if you are an older patient. This reduces the risk of dizzy or fainting spells. Alcohol can make you more drowsy and dizzy. Avoid alcoholic drinks. If you are going to have a MRI procedure, let your MRI technician know about the use of these patches. Some drug patches contain an aluminum backing that can become heated when exposed to MRI and may cause burns. You may need to temporarily remove the patch during the MRI procedure. If the patch pulls loose or falls off, fold it in half (sticky side in) and throw away out of the reach of children or pets. Replace with a fresh patch. What side effects may I notice from receiving this medicine? Side effects that you should report to your doctor or health care professional as soon as possible: -blurred vision -dry mouth -skin rash, or irritation from the skin patch -sweating -the feeling of extreme pressure in the head -unusually weak or tired Side effects that usually do not require medical attention (report to your doctor or health care professional if they continue or are bothersome): -flushing of the face or neck -headache -irregular heartbeat, palpitations -nausea, vomiting This list may not describe all possible side effects. Call your doctor for medical advice about side effects. You may report side effects to FDA at 1-800-FDA-1088. Where  should I keep my medicine? Keep out of the reach of children. Store at room temperature between 15 and 25 degrees C (59 and 77 degrees F). Avoid extremes in temperature and humidity. Throw away any unused medicine after the expiration date. NOTE: This sheet is a summary. It may not cover all possible information. If you have questions about this medicine, talk to your doctor, pharmacist, or health care provider.  2018 Elsevier/Gold Standard (2013-08-22 16:47:37)    Plantar Fasciitis Plantar fasciitis is a painful foot condition that affects the heel. It occurs when the band of tissue that connects the toes to the heel bone (plantar fascia) becomes irritated. This can happen after exercising too much or doing other repetitive activities (overuse injury). The pain from plantar fasciitis can range from mild irritation to severe pain that makes it difficult for you to walk or move. The pain is usually worse in the morning or after you have been sitting or lying down for a while. What are the causes? This condition may be caused by:  Standing for long periods of time.  Wearing shoes that do not fit.  Doing high-impact activities, including running, aerobics, and ballet.  Being overweight.  Having an abnormal way  of walking (gait).  Having tight calf muscles.  Having high arches in your feet.  Starting a new athletic activity. What are the signs or symptoms? The main symptom of this condition is heel pain. Other symptoms include:  Pain that gets worse after activity or exercise.  Pain that is worse in the morning or after resting.  Pain that goes away after you walk for a few minutes. How is this diagnosed? This condition may be diagnosed based on your signs and symptoms. Your health care provider will also do a physical exam to check for:  A tender area on the bottom of your foot.  A high arch in your foot.  Pain when you move your foot.  Difficulty moving your foot. You may  also need to have imaging studies to confirm the diagnosis. These can include:  X-rays.  Ultrasound.  MRI. How is this treated? Treatment for plantar fasciitis depends on the severity of the condition. Your treatment may include:  Rest, ice, and over-the-counter pain medicines to manage your pain.  Exercises to stretch your calves and your plantar fascia.  A splint that holds your foot in a stretched, upward position while you sleep (night splint).  Physical therapy to relieve symptoms and prevent problems in the future.  Cortisone injections to relieve severe pain.  Extracorporeal shock wave therapy (ESWT) to stimulate damaged plantar fascia with electrical impulses. It is often used as a last resort before surgery.  Surgery, if other treatments have not worked after 12 months. Follow these instructions at home:  Take medicines only as directed by your health care provider.  Avoid activities that cause pain.  Roll the bottom of your foot over a bag of ice or a bottle of cold water. Do this for 20 minutes, 3-4 times a day.  Perform simple stretches as directed by your health care provider.  Try wearing athletic shoes with air-sole or gel-sole cushions or soft shoe inserts.  Wear a night splint while sleeping, if directed by your health care provider.  Keep all follow-up appointments with your health care provider. How is this prevented?  Do not perform exercises or activities that cause heel pain.  Consider finding low-impact activities if you continue to have problems.  Lose weight if you need to. The best way to prevent plantar fasciitis is to avoid the activities that aggravate your plantar fascia. Contact a health care provider if:  Your symptoms do not go away after treatment with home care measures.  Your pain gets worse.  Your pain affects your ability to move or do your daily activities. This information is not intended to replace advice given to you by  your health care provider. Make sure you discuss any questions you have with your health care provider. Document Released: 05/23/2001 Document Revised: 01/31/2016 Document Reviewed: 07/08/2014 Elsevier Interactive Patient Education  2017 [employer].

## 2017-02-07 NOTE — Assessment & Plan Note (Addendum)
Acute. Likely candidal by hx given recent abx use for BV last month. No vaginal discharge or bleeding by hx. Less likely persistent BV. --Will empirically give single dose of Fluconazole 150 mg --Holding on further testing and examination unless symptoms persist --Patient to RTC if symptoms do not improve over the week

## 2017-02-07 NOTE — Assessment & Plan Note (Addendum)
Acute on chronic. Localized to right heel without objective findings for infection. No trauma or entry wound.  C/w plantar fascitis given location and risk factor with congenital hallux valgus. Improved with steroid injection. --Continue conservative measures including ice after activity, message prior to getting out of bed, ergonomics --Amb referral to sports med for orthotic evaluation --Given Rx for Nitrodur 0.2 mg/hr patch --RTC if symptoms worsen over the course of the month

## 2017-02-15 ENCOUNTER — Encounter: Payer: Self-pay | Admitting: Internal Medicine

## 2017-02-15 ENCOUNTER — Ambulatory Visit (AMBULATORY_SURGERY_CENTER): Payer: BC Managed Care – PPO | Admitting: Internal Medicine

## 2017-02-15 VITALS — BP 115/78 | HR 82 | Temp 97.8°F | Resp 18 | Ht 67.0 in | Wt 207.0 lb

## 2017-02-15 DIAGNOSIS — R1310 Dysphagia, unspecified: Secondary | ICD-10-CM

## 2017-02-15 MED ORDER — SODIUM CHLORIDE 0.9 % IV SOLN: 500.0000 mL | INTRAVENOUS | Status: DC

## 2017-02-15 NOTE — Op Note (Signed)
[facility] Patient Name: Jane Marshall Procedure Date: 02/15/2017 10:42 AM MRN: [id number] Endoscopist: Iva Boop , MD Age: 31 Referring MD:  Date of Birth: [birth year] Gender: Female Account #: [id number] Procedure:                Upper GI endoscopy Indications:              Dysphagia, Heartburn, Globus sensation, OTHER Medicines:                Propofol per Anesthesia, Monitored Anesthesia Care Procedure:                Pre-Anesthesia Assessment:                           - Prior to the procedure, a History and Physical                            was performed, and patient medications and                            allergies were reviewed. The patient's tolerance of                            previous anesthesia was also reviewed. The risks                            and benefits of the procedure and the sedation                            options and risks were discussed with the patient.                            All questions were answered, and informed consent                            was obtained. ASA Grade Assessment: II - A patient                            with mild systemic disease. After reviewing the                            risks and benefits, the patient was deemed in                            satisfactory condition to undergo the procedure.                           After obtaining informed consent, the endoscope was                            passed under direct vision. Throughout the                            procedure, the patient's blood pressure, pulse, and  oxygen saturations were monitored continuously. The                            Model GIF-[id number]) scope was introduced                            through the mouth, and advanced to the second part                            of duodenum. The upper GI endoscopy was                            accomplished without difficulty. The patient         tolerated the procedure well. Scope In: Scope Out: Findings:                 A 2 cm hiatal hernia was present.                           The exam was otherwise without abnormality.                           The cardia and gastric fundus were normal on                            retroflexion.                           The scope was withdrawn. Dilation was performed in                            the entire esophagus with a Maloney dilator with no                            resistance at 54 Fr. Estimated blood loss: none. Complications:            No immediate complications. Estimated Blood Loss:     Estimated blood loss: none. Impression:               - 2 cm hiatal hernia.                           - The examination was otherwise normal.                           - Dilation performed in the entire esophagus.                           - No specimens collected. Recommendation:           - Patient has a contact number available for                            emergencies. The signs and symptoms of potential  delayed complications were discussed with the                            patient. Return to normal activities tomorrow.                            Written discharge instructions were provided to the                            patient.                           - Clear liquids x 1 hour then soft foods rest of                            day. Start prior diet tomorrow.                           - Continue present medications.                           - If globus and dysphagia and heartburn persist                            after June is over call back/return Iva Boop, MD 02/15/2017 11:00:06 AM This report has been signed electronically.

## 2017-02-15 NOTE — Progress Notes (Signed)
Dental precautions given to pt.

## 2017-02-15 NOTE — Patient Instructions (Addendum)
There was a small hiatal hernia - all else ok. I did dilate the esophagus to see if that helps you.  If it does not then you should let us know.  I appreciate the opportunity to care for you. Iva Booparl E. Gessner, MD, FACG   YOU HAD AN ENDOSCOPIC PROCEDURE TODAY AT THE [facility]:   Refer to the procedure report that was given to you for any specific questions about what was found during the examination.  If the procedure report does not answer your questions, please call your gastroenterologist to clarify.  If you requested that your care partner not be given the details of your procedure findings, then the procedure report has been included in a sealed envelope for you to review at your convenience later.  YOU SHOULD EXPECT: Some feelings of bloating in the abdomen. Passage of more gas than usual.  Walking can help get rid of the air that was put into your GI tract during the procedure and reduce the bloating. If you had a lower endoscopy (such as a colonoscopy or flexible sigmoidoscopy) you may notice spotting of blood in your stool or on the toilet paper. If you underwent a bowel prep for your procedure, you may not have a normal bowel movement for a few days.  Please Note:  You might notice some irritation and congestion in your nose or some drainage.  This is from the oxygen used during your procedure.  There is no need for concern and it should clear up in a day or so.  SYMPTOMS TO REPORT IMMEDIATELY:    Following upper endoscopy (EGD)  Vomiting of blood or coffee ground material  New chest pain or pain under the shoulder blades  Painful or persistently difficult swallowing  New shortness of breath  Fever of 100F or higher  Black, tarry-looking stools  For urgent or emergent issues, a gastroenterologist can be reached at any hour by calling [phone].  Please read all handouts given to you by your recovery nurse.  DIET:  Please follow the post dilation  diet through tomorrow am,then you may proceed to your regular diet.  Drink plenty of fluids but you should avoid alcoholic beverages for 24 hours.  ACTIVITY:  You should plan to take it easy for the rest of today and you should NOT DRIVE or use heavy machinery until tomorrow (because of the sedation medicines used during the test).    FOLLOW UP: Our staff will call the number listed on your records the next business day following your procedure to check on you and address any questions or concerns that you may have regarding the information given to you following your procedure. If we do not reach you, we will leave a message.  However, if you are feeling well and you are not experiencing any problems, there is no need to return our call.  We will assume that you have returned to your regular daily activities without incident.  If any biopsies were taken you will be contacted by phone or by letter within the next 1-3 weeks.  Please call us at [phone] if you have not heard about the biopsies in 3 weeks.    SIGNATURES/CONFIDENTIALITY: You and/or your care partner have signed paperwork which will be entered into your electronic medical record.  These signatures attest to the fact that that the information above on your After Visit Summary has been reviewed and is understood.  Full responsibility of the confidentiality of this  discharge information lies with you and/or your care-partner.  Thank you for letting us take care of your healthcare needs today.

## 2017-02-15 NOTE — Progress Notes (Signed)
Alert and oriented x3, pleased with MAC, report to RN Sarah 

## 2017-02-15 NOTE — Progress Notes (Signed)
Pt's states no medical or surgical changes since previsit or office visit. 

## 2017-02-15 NOTE — Progress Notes (Signed)
Called to room to assist during endoscopic procedure.  Patient ID and intended procedure confirmed with present staff. Received instructions for my participation in the procedure from the performing physician.  

## 2017-02-16 ENCOUNTER — Telehealth: Payer: Self-pay | Admitting: *Deleted

## 2017-02-16 ENCOUNTER — Telehealth: Payer: Self-pay

## 2017-02-16 NOTE — Telephone Encounter (Signed)
Number identifier, left voicemail. 

## 2017-02-16 NOTE — Telephone Encounter (Signed)
Second attempt to contact the patient. No answer, message left for the patient.

## 2017-02-20 ENCOUNTER — Encounter: Payer: Self-pay | Admitting: Family Medicine

## 2017-02-21 ENCOUNTER — Ambulatory Visit (INDEPENDENT_AMBULATORY_CARE_PROVIDER_SITE_OTHER): Payer: BC Managed Care – PPO | Admitting: Sports Medicine

## 2017-02-21 VITALS — BP 128/74 | Ht 67.0 in | Wt 210.0 lb

## 2017-02-21 DIAGNOSIS — M722 Plantar fascial fibromatosis: Secondary | ICD-10-CM

## 2017-02-21 NOTE — Progress Notes (Signed)
   Subjective:    Patient ID: Jeanella AntonHope M Alvarez, female    DOB: 1986/09/10, 31 y.o.   MRN: [id number]  HPI Ms. Broadus [name] is a 31 yo female who presents with chronic right heel pain for the past 6 months. She was initially diagnosed with plantar fasciitis, and has been doing exercises and icing which helps a little. She had a steroid injection in April which significantly improved her symptoms for 3-4 weeks, but symptoms have now returned. 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 Her pain is with walking but worse at rest after she has been walking for a while. She also reports of right medial foot numbness and numbness of the toes. She has also been having some intermittent right knee pain that she describes as "twinges". No history of trauma. No swelling of the knee.   Review of Systems As noted above.     Objective:   Physical Exam Gen: NAD, pleasant Foot:  No swelling or erythema. Pes planus- flat longitudinal arch arch and flattening of the transverse arch bilaterally. Hallux vagus bilaterally with cross over. Pronation of feet bilaterally noted on ambulation  Tenderness to palpation of the medial calcaneus of the right foot. No other tenderness to palpation of the foot or ankle of the right foot. Heel squeeze test is negative. Normal range of motion of the ankles bilaterally  Strength: normal dorsiflexion and plantarflexion bilaterally. Decreased great toe dorsiflexion and plantar flexion bilaterally.  Normal achilles tendon reflexes bilaterally  Normal dorsalis pedis and posterior tibial pulses bilaterally  Decreased sensation to light touch on the right medial aspect of the foot    Right Knee:  No swelling or erythema.  No tenderness to palpation of joint line or patella or posterior knee Normal range of motion  Strength: 5/5 flexion and extension bilaterally      Assessment & Plan:  Plantar Fascitis:  Heal pain is consistent with plantar fascitis.  We briefly  discussed her intermittent right knee pain without history of swelling or injury, and decided on a trail of sleeve for support.  - reviewed appropriate stretching exercises, continue icing.  - green insoles with scaphoid pad - follow up in 3-4 weeks for custom orthotics   Palma HolterKanishka G Gunadasa, MD PGY 2 Family Medicine   Patient seen and evaluated with the resident. I agree with the above plan of care. We will try a pair of sports insoles with scaphoid pads and patient will follow-up in 3-4 weeks. If these are comfortable we will construct custom orthotics at that time. She will continue with her calf stretching and I've shown her the proper way to do a plantar fascial stretches. I've encouraged her to do that several times a day. She will continue icing as well.

## 2017-03-19 ENCOUNTER — Ambulatory Visit: Payer: BC Managed Care – PPO | Admitting: Sports Medicine

## 2017-04-18 ENCOUNTER — Other Ambulatory Visit (HOSPITAL_COMMUNITY)
Admission: RE | Admit: 2017-04-18 | Discharge: 2017-04-18 | Disposition: A | Discharge status: Home / Self Care | Payer: BC Managed Care – PPO | Source: Ambulatory Visit | Attending: Family Medicine | Admitting: Family Medicine

## 2017-04-18 ENCOUNTER — Other Ambulatory Visit: Payer: Self-pay | Admitting: Internal Medicine

## 2017-04-18 ENCOUNTER — Encounter: Payer: Self-pay | Admitting: Internal Medicine

## 2017-04-18 ENCOUNTER — Ambulatory Visit (INDEPENDENT_AMBULATORY_CARE_PROVIDER_SITE_OTHER): Payer: BC Managed Care – PPO | Admitting: Internal Medicine

## 2017-04-18 VITALS — BP 101/60 | HR 55 | Temp 98.2°F | Wt 216.2 lb

## 2017-04-18 DIAGNOSIS — Z3009 Encounter for other general counseling and advice on contraception: Secondary | ICD-10-CM | POA: Diagnosis not present

## 2017-04-18 DIAGNOSIS — Z113 Encounter for screening for infections with a predominantly sexual mode of transmission: Secondary | ICD-10-CM | POA: Insufficient documentation

## 2017-04-18 LAB — POCT WET PREP (WET MOUNT)
Clue Cells Wet Prep Whiff POC: NEGATIVE
TRICHOMONAS WET PREP HPF POC: ABSENT

## 2017-04-18 LAB — POCT URINE PREGNANCY: Preg Test, Ur: NEGATIVE

## 2017-04-18 MED ORDER — FLUCONAZOLE 150 MG PO TABS: 150.0000 mg | ORAL_TABLET | Freq: Once | ORAL | 0 refills | Status: AC

## 2017-04-18 NOTE — Progress Notes (Signed)
[facility] Progress Note  Subjective:  Jane Marshall is a 31 y.o. female with history of eczema and amenorrhea who presents for STD screening and to discuss birth control.  #STD screening: - Patient had sex with new female partner about 2 weeks ago and condom broke. - Last period was at end of June but period has been somewhat irregular. - Not on birth control, as for last 4 years had been with female partners. - Has had some vaginal itching but was present prior to intercourse - Had some increased discharge but has since resolved - Does douche ROS: No abdominal pain but has had a little bit of diarrhea  #Birth control: - Had been on OCP as a teen but would often forget to take it - Does smoke occasionally and not planning on quitting at this time - Grandmother had blood clots - Has headaches from time to time but no migraines  Chief Complaint  Patient presents with  . STD check  . Vaginal Itching    x 2weeks   No Known Allergies  Objective: Blood pressure 101/60, pulse (!) 55, temperature 98.2 F (36.8 C), temperature source Oral, weight 216 lb 3.2 oz (98.1 kg), last menstrual period 03/10/2017, SpO2 99 %. Body mass index is 33.86 kg/m. Constitutional: Obese, pleasant female in NAD Abdominal: Soft. +BS, NT, ND GU: No lesions noted on speculum exam. Moderate amount of thick white discharge. No cervical motion tenderness. Chaperone present.  Skin: Skin is warm and dry. No rash noted.  Psychiatric: Normal mood and affect.  Vitals reviewed  Assessment/Plan: Screen for STD (sexually transmitted disease) - Not endorsing symptoms concerning for PID. No CMT on exam. Suspect yeast infection given appearance of discharge. - Will obtain wet prep, gc/chlamydia, HIV, RPR screening. - Will call patient with results --> wet prep showed few yeast. Left message for patient and ordered diflucan to her pharmacy. - Advised patient not to douche.   Birth control  counseling - Reviewed different options. Patient does plan to remain sexually active with men and would like to start birth control. - Patient said she would like to start depo. Will check u preg given missed period and recent intercourse with broken condom. --> This was negative, but when shot brought in for patient, she decided she would like to think about her options a little more. Counseled that OCP not a safe option for her given smoking.   Follow-up prn. Discussed smoking cessation.   Dani GobbleHillary Fitzgerald, MD [facility], PGY-3

## 2017-04-18 NOTE — Assessment & Plan Note (Addendum)
-   Not endorsing symptoms concerning for PID. No CMT on exam. Suspect yeast infection given appearance of discharge. - Will obtain wet prep, gc/chlamydia, HIV, RPR screening. - Will call patient with results --> wet prep showed few yeast. Left message for patient and ordered diflucan to her pharmacy. - Advised patient not to douche.

## 2017-04-18 NOTE — Patient Instructions (Signed)
Ms. Jane Marshall,  I will call you with your results within the next few days.  Irregular bleeding after starting depo is not unusual.  Best, Dr. Sampson GoonFitzgerald

## 2017-04-18 NOTE — Assessment & Plan Note (Addendum)
-   Reviewed different options. Patient does plan to remain sexually active with men and would like to start birth control. - Patient said she would like to start depo. Will check u preg given missed period and recent intercourse with broken condom. --> This was negative, but when shot brought in for patient, she decided she would like to think about her options a little more. Counseled that OCP not a safe option for her given smoking.

## 2017-04-19 LAB — HIV ANTIBODY (ROUTINE TESTING W REFLEX): HIV SCREEN 4TH GENERATION: NONREACTIVE

## 2017-04-19 LAB — CERVICOVAGINAL ANCILLARY ONLY
Chlamydia: NEGATIVE
Neisseria Gonorrhea: NEGATIVE

## 2017-04-19 LAB — RPR: RPR Ser Ql: NONREACTIVE

## 2017-04-23 ENCOUNTER — Encounter: Payer: Self-pay | Admitting: Internal Medicine

## 2017-08-13 ENCOUNTER — Encounter: Payer: Self-pay | Admitting: Family Medicine

## 2017-08-13 ENCOUNTER — Ambulatory Visit: Payer: BC Managed Care – PPO | Admitting: Family Medicine

## 2017-08-13 ENCOUNTER — Encounter: Payer: Self-pay | Admitting: Psychology

## 2017-08-13 DIAGNOSIS — J069 Acute upper respiratory infection, unspecified: Secondary | ICD-10-CM

## 2017-08-13 DIAGNOSIS — B9789 Other viral agents as the cause of diseases classified elsewhere: Secondary | ICD-10-CM

## 2017-08-13 DIAGNOSIS — R45851 Suicidal ideations: Secondary | ICD-10-CM | POA: Diagnosis not present

## 2017-08-13 NOTE — Progress Notes (Signed)
Dr. Nelson ChimesAmin requested a Behavioral Health Consult.   Presenting Issue:  Depression, anxiety, SI  Report of symptoms:  Jane Marshall is very depressed and anxious. She recently broke up with her partner and has experienced several deaths in her family.  Duration of CURRENT symptoms:  Several years -- since around 2007  Age of onset of first mood disturbance:  Patient reports being depressed all of her life due to stressors, including depressed mother, alcoholic father, time in foster care, and subsequent adoption out of her family.  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   Psychiatric History - Diagnoses: none, but she reports that she has had depression and anxiety for a very long time. - Hospitalizations: none - Pharmacotherapy: none -- she is open to receiving an SSRI - Outpatient therapy: as a child she received counseling.  Family history of psychiatric issues:  Biological mother had depression; father was an alcoholic.  Current and history of substance use:  She drinks alcohol (several drinks per week, not every day) and uses marijuana sometimes as well. I provided some psychoeducation regarding the detrimental effects of alcohol and marijuana on depression and anxiety.   Medical conditions that might explain or contribute to symptoms:  She is not feeling well today.  PHQ-9:  25, SI GAD-7:  20, not difficult at all MDQ (if indicated):  Negative but she endorsed racing thoughts and hyperactivity.  Assessment / Plan / Recommendations: Suggested that she talk further with Dr. Nelson ChimesAmin regarding starting an SSRI. Provided support and reflective listening. Also recommended some behavioral activation strategies; she identified being with her sister's children as something she enjoys. I explained that her recent stressors would make anyone feel depressed so it will take some time to feel  better. We also discussed the role of integrative care and that she may be better served with more intensive treatment. She is scheduled to return on December 3 at 2 pm I will explore symptoms of trauma at that time because of her history in foster care/separation from her parents. Because this patient expressed, SI, I conducted a Suicide Assessment:  Suicide Assessment  Plan: - How specific is the plan: she has no plan - How lethal are the means: she has no guns, but has OTC meds - Does the patient have access to the means: no weapons, but does have OTC meds - Does the patient have social support: her sister is supportive; she has no friends  Protective factors (what has kept the patient from self-harm thus far):  She is high functioning with regard to work and chores around the house; she wants to spend time with her sister's children and her former partner's children  Substance use / abuse:  She does use alcohol and marijuana, but reports only moderate use.  Presence of hallucinations / delusions: no, except she did report hearing her voice telling her that she was no good. No psychotic symptoms reported. MDQ was negative.  History of SI / Attempts: no  Family history of attempted or completed suicide: not asked, need to follow up.  Duration and Intensity of SI:  Several years.  History of prior psychiatric hospitalizations: no  Chart review for additional risk factors (cite chronic pain, insomnia, panic attacks, age, gender, if present): no  Coping mechanisms: spending time with children, just push through the pain  How likely are you to act  on these thoughts of hurting yourself or ending your life sometime over the next month? NOT LIKELY AT ALL  Because this patient stated she had no plan and that she was not likely to hurt herself in the next week or month, she was allowed to go but was given information on contacting us 24/7 if needed. I scheduled her to return in one week on  December 10 at 2 pm. At that time, I will ask about her family's history of suicide attempts, discuss whether she has experienced any trauma, and formulate a plan for her future treatment.  Warmhandoff complete.

## 2017-08-13 NOTE — Progress Notes (Signed)
Subjective:   Patient ID: Jane Marshall    DOB: [birth year], 31 y.o. female   MRN: [id number]  CC: suicidal  HPI: Jane Marshall is a 31 y.o. female who presents to clinic today for depression and suicidality.   Suicidal  She reports that she and her partner of 8 years recently split up and she has been feeling suicidal since the past 1 week.  She has had 3 close family members pass away over the course of the past 2 months (niece and two uncles).  She has been feeling depressed but has never been diagnosed with it and does not wish to be labeled.  Her mother passed away in Oct 28, 2005 and at that time she kept her emotions to herself as she felt she needed to be strong for those around her.  She feels as though keeping things inside has taken a toll on her.  She denies HI.  Coping mechanisms include being surrounded by children - her girlfriend had two children whom she was close with but no longer sees.  She enjoys being around her sister's children and she feels as though children put a light in her life.  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  She has not been eating or sleeping well as she normally does.  She denies past suicide attempts and has only thought about it but does not have a plan.  She does not own a gun or have any access to firearms.  She has not seeked help or counseling in the past.  She denies visual hallucinations and endorses hearing her own voice telling herself that she is a failure and is not good enough for anyone.  She admits to smoking marijuana and social alcohol use.  She has considered drinking more alcohol as a means of coping however has not done so.   Congestion Cough and congestion since Wednesday. Cough is productive of green sputum.  Had a low grade fever to 100 on Wednesday.  She endorses her hearing feels muffled.  Denies shortness of breath, chest pain and sore throat.  She has taken Robitussin and cough drops which helps.  She endorses body aches  and has been around sick children with similar symptoms.     ROS: Denies fevers, chills, nausea, vomiting, diarrhea.  Denies abdominal pain.   [place]: Pertinent past medical, surgical, family, and social history were reviewed and updated as appropriate. Smoking status reviewed. Medications reviewed.  Objective:   BP 126/80   Pulse 83   Temp 98.6 F (37 C) (Oral)   Ht 5' 7" (1.702 m)   Wt 211 lb 12.8 oz (96.1 kg)   SpO2 99%   BMI 33.17 kg/m  Vitals and nursing note reviewed.  General: 31 yo female, NAD  HEENT: PERRL, EOMI, MMM, o/p clear. No tonsillar exudate  Neck: no LAD  CV: RRR no MRG  Lungs: CTAB, non-laboured Abdomen: soft, NTND, +bs  Skin: warm, dry  Extremities: brisk cap refill  Neuro: alert, no focal deficits  Psych: tearful and depressed mood, normal affect   Assessment & Plan:   Suicidal ideation Suicidal ideation with symptoms of depression including anhedonia, sleep disturbance and change in eating pattern due to recent stressors of death in family and ending a relationship of 8 years.   -No red flags on exam - she is appropriate during our encounter and denies having a plan or access to firearms.   Patient open to talking to [facility]  as she is familiar with their services and has some family members who have been seen.  -PHQ-9 in office visit today score of 25  -Discussed with Larene Beach from [facility] with whom she met and will be following up with -will continue to monitor   Viral URI with cough Symptoms likely consistent with viral URI.  No red flags on exam and she is otherwise well-appearing. Anticipate she will turn the corner over the next several days.  -encourage warm fluids, honey for sore throat -return precautions discussed   Follow up: 2 weeks   Lovenia Kim, MD [place], PGY-2 08/13/2017 5:36 PM

## 2017-08-13 NOTE — Assessment & Plan Note (Signed)
Symptoms likely consistent with viral URI.  No red flags on exam and she is otherwise well-appearing. Anticipate she will turn the corner over the next several days.  -encourage warm fluids, honey for sore throat -return precautions discussed

## 2017-08-13 NOTE — Assessment & Plan Note (Addendum)
Suicidal ideation with symptoms of depression including anhedonia, sleep disturbance and change in eating pattern due to recent stressors of death in family and ending a relationship of 8 years.   -No red flags on exam - she is appropriate during our encounter and denies having a plan or access to firearms.   Patient open to talking to [facility] as she is familiar with their services and has some family members who have been seen.  -PHQ-9 in office visit today score of 25  -Discussed with Larene Beach from [facility] with whom she met and will be following up with -will continue to monitor

## 2017-08-20 ENCOUNTER — Ambulatory Visit: Payer: BC Managed Care – PPO

## 2017-08-23 ENCOUNTER — Telehealth: Payer: Self-pay | Admitting: Family Medicine

## 2017-08-23 ENCOUNTER — Ambulatory Visit: Payer: BC Managed Care – PPO | Admitting: Internal Medicine

## 2017-08-23 ENCOUNTER — Other Ambulatory Visit: Payer: Self-pay

## 2017-08-23 ENCOUNTER — Ambulatory Visit (INDEPENDENT_AMBULATORY_CARE_PROVIDER_SITE_OTHER): Payer: BC Managed Care – PPO | Admitting: Licensed Clinical Social Worker

## 2017-08-23 ENCOUNTER — Encounter: Payer: Self-pay | Admitting: Internal Medicine

## 2017-08-23 DIAGNOSIS — B9789 Other viral agents as the cause of diseases classified elsewhere: Secondary | ICD-10-CM

## 2017-08-23 DIAGNOSIS — F322 Major depressive disorder, single episode, severe without psychotic features: Secondary | ICD-10-CM | POA: Diagnosis not present

## 2017-08-23 DIAGNOSIS — F329 Major depressive disorder, single episode, unspecified: Secondary | ICD-10-CM

## 2017-08-23 DIAGNOSIS — J069 Acute upper respiratory infection, unspecified: Secondary | ICD-10-CM

## 2017-08-23 DIAGNOSIS — R4589 Other symptoms and signs involving emotional state: Secondary | ICD-10-CM

## 2017-08-23 MED ORDER — PAROXETINE HCL 10 MG PO TABS: 10.0000 mg | ORAL_TABLET | Freq: Every day | ORAL | 1 refills | Status: DC

## 2017-08-23 MED ORDER — BENZONATATE 100 MG PO CAPS: 200.0000 mg | ORAL_CAPSULE | Freq: Three times a day (TID) | ORAL | 0 refills | Status: DC | PRN

## 2017-08-23 MED ORDER — ESCITALOPRAM OXALATE 10 MG PO TABS: 10.0000 mg | ORAL_TABLET | Freq: Every day | ORAL | 1 refills | Status: DC

## 2017-08-23 MED ORDER — GUAIFENESIN 200 MG PO TABS: 200.0000 mg | ORAL_TABLET | Freq: Three times a day (TID) | ORAL | 0 refills | Status: DC | PRN

## 2017-08-23 NOTE — Progress Notes (Signed)
   Subjective:   Patient: Jane Marshall       Birthdate: 10/03/1985       MRN: [id number]      HPI  Jane Marshall is a 31 y.o. female presenting for depression/anxiety and nasal congestion/cough.   Depression/anxiety Has been meeting with [facility]. Today met with Neoma Laming due to rescheduling after snow. Reported to Neoma Laming worsening symptoms of anxiety and depression. As such, Neoma Laming felt that patient should be seen by a physician to discuss starting medication today.  Patient reports that her symptoms have been worsening over the past few months. Does have two new stressors in her life - recent break up and death of a family member. Denies SI/HI, though does report passing thoughts that she may be better off dead. Has never been on medication for depression before but is interested in beginning today as she feels her symptoms are so overwhelming. Feels that meeting with [facility] is helpful but is not enough anymore.   Nasal congestion/cough Present >1 wk. Was seen in office about 1 wk ago for this. Symptoms thought to be viral so symptomatic management discussed. Cough productive of green sputum. Has difficulty breathing through nose at night. Denies SOB. Afebrile. Has been using Robitussin and cough drops which haven't helped. No improvement in symptoms since last week. Denies sore throat, diarrhea, vomiting.   Smoking status reviewed. Patient is former smoker.   Review of Systems See HPI.     Objective:  Physical Exam  Constitutional: She is oriented to person, place, and time and well-developed, well-nourished, and in no distress.  HENT:  Head: Normocephalic and atraumatic.  Nose: Nose normal.  Mouth/Throat: Oropharynx is clear and moist. No oropharyngeal exudate.  Eyes: Conjunctivae are normal. Right eye exhibits no discharge. Left eye exhibits no discharge.  Cardiovascular: Normal rate, regular rhythm and normal heart sounds.  No murmur heard. Pulmonary/Chest: Effort normal and  breath sounds normal. No respiratory distress. She has no wheezes.  Neurological: She is alert and oriented to person, place, and time.  Skin: Skin is warm and dry.  Psychiatric:  Tearful at times during encounter      Assessment & Plan:  Viral URI with cough Has not improved, though still feel is most likely viral in etiology. Remains afebrile with no SOB. Well-appearing on exam and lungs CTAB. Will continue to treat conservatively.  - Prescribed Tessalon perles and guaifenesin. Instructed to take together for cough.  - Saline nasal spray for congestion  Depression PHQ-9 score 26. GAD 7 score 21. Denies SI/HI. Has never been on medication before. Already meeting with [facility], and has another appt scheduled with Neoma Laming in three weeks. Will begin paroxetine today to treat both depression and anxiety, as well as patient's reported insomnia, as paroxetine with some sedative effects. Discussed that depressive symptoms can worsen when beginning an anti-depressant, and she should call immediately if this occurs. Also discussed that meds may take a while to take effect, so she should continue taking even if she does not immediately see improvement in mood.  - Begin paroxetine '10mg'$  qhs x1wk, then increase to '20mg'$  qhs if tolerated - F/u in 3 weeks - Return precautions discussed    Adin Hector, MD, MPH PGY-3 [facility] Pager [phone]

## 2017-08-23 NOTE — Telephone Encounter (Signed)
Congestion is no better, would like an antibotic.  [facility] [address]

## 2017-08-23 NOTE — Patient Instructions (Addendum)
It was nice meeting you today Jane Marshall!  Please begin taking paroxetine (Paxil) to help with anxiety and depression. Begin by taking one tablet at night before bed. After one week, you can increase to two tablets daily. If you find that your symptoms are worsening, please let us know. Your symptoms may not improve right away, so continue to take the medication even if you do not feel your mood is improving. DO NOT begin taking escitalopram (Lexapro).   Paxil should help with sleep, however if you are still having difficulty sleeping, you can try over the counter melatonin tablets.   For your cough, please take one Tessalon perle and one guaifenesin tablet together up to every 8 hours as needed. You can also use over the counter saline nasal spray to help with congestion, especially before going to bed.   I will see you back in about 3 weeks to see how you are doing.   If you have any questions or concerns, please feel free to call the clinic.   Be well,  Dr. Natale MilchLancaster

## 2017-08-23 NOTE — Telephone Encounter (Signed)
Patient needs to be formally evaluated in the clinic. Will not prescribe antibiotics over phone.

## 2017-08-23 NOTE — Progress Notes (Signed)
  Type of Service: Integrated Behavioral Health F/U visit from warm hand off  Estimate time:35 minutes : Interpreter:No.  SUBJECTIVE: Jane Marshall is a 31 y.o. female referred for assistance with managing  symptoms of  anxiety and depression as well as relationship stressors. Patient reports: fatigue, irritability, loss of interest in favorite activities, mood swings and sleep disturbance. Duration of problem: several years with symptoms of depression & anxiety but was able to manage Impact: crying and difficult with work and home Current / Hx of substance use: several drinks per week, and use of marijuana.  LIFE CONTEXT:  Family & Social:patient lives with friend  ,family lives local, no children  School / Work : works [employer]  5:am to 1:30PM  Life changes: Recent death in family, and 8 year relationship breakup  GOALS: Patient will reduce symptoms of: anxiety and depression, and increase  ability WU:JWJXBJof:coping skills, self-management skills and stress reduction, will also Begin healthy grieving over loss. INTERVENTION: , Reflective listening, Behavioral Therapy (Relaxed breathing), Psychoeducation, Supportive Counseling and Consult MD ; Sleep Hygiene   PHQ 9=26, indication of: severe depression. GAD-7=21,indication of : severe anxiety.  ISSUES DISCUSSED: Integrated care services, support system, previous and current coping skills, community resources for grief, things patient enjoy or use to enjoy doing, demonstration of relaxed breathing, making appointment with medical provider and safety plan.     ASSESSMENT:Patient currently experiencing symptoms of severe depression and anxiety. No hx of medication or therapy in past 10 years.  Patient positive #9 for PHQ-9. Denies that she would hurt self, act on the thoughts nor does she have a plan.  Reports thoughts that it would better if she was not here.  Symptoms exacerbated by recently family death and relationship stressors. LCSW consulted to get  patient an appointment to see MD while in the office.  Patient is tearful and would like to start psychotropic medication.  Patient may benefit from, and is in agreement to receive further assessment and brief therapeutic interventions from Integrated Behavioral Health to assist with managing her symptoms.  PLAN:   1.Patient will F/U with LCSW in 3 weeks  2. LCSW will F/U with phone call in 1 week [phone]  3. Behavioral recommendations: relaxed breathing and sleep Hygiene check list  4. Referral:none at this time   5.Patient will start taking paxil as prescribed by Dr. Natale MilchLancaster.   Sammuel Hineseborah Rheya Minogue, LCSW Licensed Clinical Social Worker [facility]   [phone] 9:27 AM

## 2017-08-24 ENCOUNTER — Encounter: Payer: Self-pay | Admitting: Internal Medicine

## 2017-08-24 DIAGNOSIS — F32A Depression, unspecified: Secondary | ICD-10-CM | POA: Insufficient documentation

## 2017-08-24 DIAGNOSIS — F329 Major depressive disorder, single episode, unspecified: Secondary | ICD-10-CM | POA: Insufficient documentation

## 2017-08-24 NOTE — Assessment & Plan Note (Signed)
PHQ-9 score 26. GAD 7 score 21. Denies SI/HI. Has never been on medication before. [facility] and has another appt scheduled with Gavin PoundDeborah in three weeks. Will begin paroxetine today to treat both depression and anxiety, as well as patient's reported insomnia, as paroxetine with some sedative effects. Discussed that depressive symptoms can worsen when beginning an anti-depressant, and she should call immediately if this occurs. Also discussed that meds may take a while to take effect, so she should continue taking even if she does not immediately see improvement in mood.  - Begin paroxetine 10mg  qhs x1wk, then increase to 20mg  qhs if tolerated - F/u in 3 weeks - Return precautions discussed

## 2017-08-24 NOTE — Assessment & Plan Note (Signed)
Has not improved, though still feel is most likely viral in etiology. Remains afebrile with no SOB. Well-appearing on exam and lungs CTAB. Will continue to treat conservatively.  - Prescribed Tessalon perles and guaifenesin. Instructed to take together for cough.  - Saline nasal spray for congestion

## 2017-08-27 NOTE — Telephone Encounter (Signed)
Pt states that she is going to wait until her appt in a week.Jane Marshall, CMA

## 2017-08-30 ENCOUNTER — Telehealth: Payer: Self-pay | Admitting: Licensed Clinical Social Worker

## 2017-08-30 NOTE — Progress Notes (Signed)
Service : Integrated Care F/U Call   F/U call to patient reference interventions discussed during visit. Left voice message to schedule f/u appointment with LCSW prior to medical appointment on 09/12/17 if she was interested.  Sammuel Hineseborah Hogan Hoobler, LCSW Licensed Clinical Social Worker [facility]   [phone] 4:20 PM

## 2017-09-13 ENCOUNTER — Ambulatory Visit: Payer: BC Managed Care – PPO | Admitting: Internal Medicine

## 2017-09-20 ENCOUNTER — Other Ambulatory Visit: Payer: Self-pay

## 2017-09-20 ENCOUNTER — Encounter: Payer: Self-pay | Admitting: Internal Medicine

## 2017-09-20 ENCOUNTER — Encounter: Payer: Self-pay | Admitting: Licensed Clinical Social Worker

## 2017-09-20 ENCOUNTER — Ambulatory Visit: Payer: BC Managed Care – PPO | Admitting: Internal Medicine

## 2017-09-20 DIAGNOSIS — F322 Major depressive disorder, single episode, severe without psychotic features: Secondary | ICD-10-CM

## 2017-09-20 MED ORDER — PAROXETINE HCL 20 MG PO TABS: 20.0000 mg | ORAL_TABLET | Freq: Every day | ORAL | 3 refills | Status: DC

## 2017-09-20 NOTE — Progress Notes (Signed)
   Subjective:   Patient: Jane Marshall       Birthdate: January 02, 1986       MRN: [id number]      HPI  Jane Marshall is a 32 y.o. female presenting for depression f/u.   Depression At last appt on 12/13, patient screened positive for severe depression and axiety. She was started on paroxetine 10mg  qhs, with instructions to increase to 20mg  qhs if able to tolerate side effects. Since that time, patient has been taking paroxetine 20mg  qhs, and says she feels "great." Says sayd she "has been hoping I could feel this way for a long time," and is very pleased with her mood. She thinks improvement in mood is due mostly to medication, but also in part to the "new year" and the "new direction" she is taking in her life. She does endorse drowsiness with paroxetine. She takes it around 8:30PM and goes to bed shortly thereafter. Notices drowsiness in the morning between around 8-9:30AM, however if she stays busy this is not a problem. The drowsiness is not so problematic that she would like to switch medications. Denies SI/HI.   Smoking status reviewed. Patient is former smoker.   Review of Systems See HPI.     Objective:  Physical Exam  Constitutional: She is oriented to person, place, and time.  Smiling, very pleasant female in NAD  HENT:  Head: Normocephalic and atraumatic.  Pulmonary/Chest: Effort normal. No respiratory distress.  Neurological: She is alert and oriented to person, place, and time.  Skin: Skin is warm and dry.  Psychiatric: Affect and judgment normal.      Assessment & Plan:  Depression Significantly improved after beginning paroxetine 20mg  qhs. PHQ-9 and GAD-7 scores improved from 26 and 21 to 0 and 0 respectively. Patient endorses drowsiness, but thinks that benefits outweigh the side effects and does not want to make med changes today. Will continue current regimen. Patient also has appt with Sammuel Hineseborah Moore after current appt today.  - F/u in 3 months   Tarri AbernethyAbigail J Lilu Mcglown,  MD, MPH PGY-3 [facility] Pager [phone]

## 2017-09-20 NOTE — Assessment & Plan Note (Signed)
Significantly improved after beginning paroxetine 20mg  qhs. PHQ-9 and GAD-7 scores improved from 26 and 21 to 0 and 0 respectively. Patient endorses drowsiness, but thinks that benefits outweigh the side effects and does not want to make med changes today. Will continue current regimen. Patient also has appt with Sammuel Hineseborah Moore after current appt today.  - F/u in 3 months

## 2017-09-20 NOTE — Patient Instructions (Signed)
It was nice seeing you again today Jane Marshall!  Please continue taking paroxetine 20mg  at bedtime. You can continue to take two of the 10mg  tablets until you run out, then switch to one of the 20mg  tablets I have prescribed today.   If you find that your mood is changing again, please call to schedule an appointment. Otherwise, I will see you back in about three months to make sure you are continuing to do well.   If you have any questions or concerns, please feel free to call the clinic.   Be well,  Dr. Natale MilchLancaster

## 2017-09-20 NOTE — Progress Notes (Signed)
Type of Service: Integrated Behavioral Health 1st F/U Visit Total time:15 minutes :  Interpreter:No.    Reason for follow-up: Continue brief intervention to assist patient with managing symptoms of depression, as well as managing stressors . Patient reports  no impairment in daily functions at work or home. Patient is pleasant, smiling and engaged in conversation.   PHQ 9=0 and . GAD-7=0,   Patient is making progress towards goal.  States she is feeling much better, more like her old self. This is also evident by PHQ-9/GAD score above. Reports things are going well with work and her relationship. Patient now want to focus on personal goals and professional development.  Intervention: Solution-Focused Strategies, Goal setting, Reflective listening, and Psychoeducation   Assessment:Patient displays no symptoms of depression or anxiety at this time.  She is feeing good about herself and would like to set personal goals and being able to manage stressors, she may benefit from, and is in agreement to continue further brief therapeutic interventions to assist with this goal.  Plan:  1. Patient will F/U with LCSW in a few weeks 2. Patient will seek out information for taking classes 3. Patient will identify things she enjoys and what she would like to do  Jane Hineseborah Yvonne Petite, LCSW Licensed Clinical Social Worker [facility]   [phone] 10:15 AM

## 2017-10-05 ENCOUNTER — Telehealth: Payer: Self-pay | Admitting: *Deleted

## 2017-10-05 ENCOUNTER — Ambulatory Visit (INDEPENDENT_AMBULATORY_CARE_PROVIDER_SITE_OTHER): Payer: BC Managed Care – PPO | Admitting: Family Medicine

## 2017-10-05 ENCOUNTER — Other Ambulatory Visit: Payer: Self-pay

## 2017-10-05 VITALS — BP 120/80 | HR 91 | Temp 98.9°F | Ht 67.0 in | Wt 206.0 lb

## 2017-10-05 DIAGNOSIS — B9789 Other viral agents as the cause of diseases classified elsewhere: Secondary | ICD-10-CM

## 2017-10-05 DIAGNOSIS — J069 Acute upper respiratory infection, unspecified: Secondary | ICD-10-CM | POA: Diagnosis not present

## 2017-10-05 DIAGNOSIS — R6889 Other general symptoms and signs: Secondary | ICD-10-CM | POA: Diagnosis not present

## 2017-10-05 LAB — INFLUENZA A AND B
Influenza A Ag, EIA: POSITIVE — AB
Influenza B Ag, EIA: NEGATIVE

## 2017-10-05 MED ORDER — ONDANSETRON HCL 4 MG PO TABS: 4.0000 mg | ORAL_TABLET | Freq: Three times a day (TID) | ORAL | 0 refills | Status: DC | PRN

## 2017-10-05 NOTE — Telephone Encounter (Addendum)
Patient left message on nurse line requesting note for work for this entire week. Please let pt know if this can be done. [phone]. Kinnie FeilL. Ahonesty Woodfin, RN, BSN

## 2017-10-05 NOTE — Assessment & Plan Note (Addendum)
Patient with symptoms of likely viral etiology since Tuesday. Afebrile in office but has measured fever at home. Lungs CTAB on exam, no wheezing noted.  -flu swab obtained. Patient is out of window for treatment with Tamiflu but patient stated she would like to definitively know whether or not she had flu  -encouraged OTC tylenol for fever and muscle aches as ibuprofen has not been beneficial. Recommended alternating between the two  -zofran prn  -encouraged oral hydration with pedialyte or gatorade to replenish electrolytes  -follow up in 1 week if no improvement

## 2017-10-05 NOTE — Patient Instructions (Signed)
Influenza, Adult Influenza ("the flu") is an infection in the lungs, nose, and throat (respiratory tract). It is caused by a virus. The flu causes many common cold symptoms, as well as a high fever and body aches. It can make you feel very sick. The flu spreads easily from person to person (is contagious). Getting a flu shot (influenza vaccination) every year is the best way to prevent the flu. Follow these instructions at home:  Take over-the-counter and prescription medicines only as told by your doctor.  Use a cool mist humidifier to add moisture (humidity) to the air in your home. This can make it easier to breathe.  Rest as needed.  Drink enough fluid to keep your pee (urine) clear or pale yellow.  Cover your mouth and nose when you cough or sneeze.  Wash your hands with soap and water often, especially after you cough or sneeze. If you cannot use soap and water, use hand sanitizer.  Stay home from work or school as told by your doctor. Unless you are visiting your doctor, try to avoid leaving home until your fever has been gone for 24 hours without the use of medicine.  Keep all follow-up visits as told by your doctor. This is important. How is this prevented?  Getting a yearly (annual) flu shot is the best way to avoid getting the flu. You may get the flu shot in late summer, fall, or winter. Ask your doctor when you should get your flu shot.  Wash your hands often or use hand sanitizer often.  Avoid contact with people who are sick during cold and flu season.  Eat healthy foods.  Drink plenty of fluids.  Get enough sleep.  Exercise regularly. Contact a doctor if:  You get new symptoms.  You have: ? Chest pain. ? Watery poop (diarrhea). ? A fever.  Your cough gets worse.  You start to have more mucus.  You feel sick to your stomach (nauseous).  You throw up (vomit). Get help right away if:  You start to be short of breath or have trouble breathing.  Your  skin or nails turn a bluish color.  You have very bad pain or stiffness in your neck.  You get a sudden headache.  You get sudden pain in your face or ear.  You cannot stop throwing up. This information is not intended to replace advice given to you by your health care provider. Make sure you discuss any questions you have with your health care provider. Document Released: 06/06/2008 Document Revised: 02/03/2016 Document Reviewed: 06/22/2015 Elsevier Interactive Patient Education  2017 Elsevier Inc.   Viral Respiratory Infection A viral respiratory infection is an illness that affects parts of the body used for breathing, like the lungs, nose, and throat. It is caused by a germ called a virus. Some examples of this kind of infection are:  A cold.  The flu (influenza).  A respiratory syncytial virus (RSV) infection.  How do I know if I have this infection? Most of the time this infection causes:  A stuffy or runny nose.  Yellow or green fluid in the nose.  A cough.  Sneezing.  Tiredness (fatigue).  Achy muscles.  A sore throat.  Sweating or chills.  A fever.  A headache.  How is this infection treated? If the flu is diagnosed early, it may be treated with an antiviral medicine. This medicine shortens the length of time a person has symptoms. Symptoms may be treated with over-the-counter and  prescription medicines, such as:  Expectorants. These make it easier to cough up mucus.  Decongestant nasal sprays.  Doctors do not prescribe antibiotic medicines for viral infections. They do not work with this kind of infection. How do I know if I should stay home? To keep others from getting sick, stay home if you have:  A fever.  A lasting cough.  A sore throat.  A runny nose.  Sneezing.  Muscles aches.  Headaches.  Tiredness.  Weakness.  Chills.  Sweating.  An upset stomach (nausea).  Follow these instructions at home:  Rest as much as  possible.  Take over-the-counter and prescription medicines only as told by your doctor.  Drink enough fluid to keep your pee (urine) clear or pale yellow.  Gargle with salt water. Do this 3-4 times per day or as needed. To make a salt-water mixture, dissolve -1 tsp of salt in 1 cup of warm water. Make sure the salt dissolves all the way.  Use nose drops made from salt water. This helps with stuffiness (congestion). It also helps soften the skin around your nose.  Do not drink alcohol.  Do not use tobacco products, including cigarettes, chewing tobacco, and e-cigarettes. If you need help quitting, ask your doctor. Get help if:  Your symptoms last for 10 days or longer.  Your symptoms get worse over time.  You have a fever.  You have very bad pain in your face or forehead.  Parts of your jaw or neck become very swollen. Get help right away if:  You feel pain or pressure in your chest.  You have shortness of breath.  You faint or feel like you will faint.  You keep throwing up (vomiting).  You feel confused. This information is not intended to replace advice given to you by your health care provider. Make sure you discuss any questions you have with your health care provider. Document Released: 08/10/2008 Document Revised: 02/03/2016 Document Reviewed: 02/03/2015 Elsevier Interactive Patient Education  [employer]2018 Elsevier Inc.   It was a pleasure seeing you today.   Today we discussed your viral symptoms  For your viral symptoms: we will do a flu swab today to confirm whether or not you had the flu. Unfortunately, you are out of the window to start tamiflu. I recommend tylenol for your headaches and muscle aches and you can alternate with ibuprofen as well. Please try and stay hydrated. You can use pedialyte or gatorade for hydration. I have sent a prescription for zofran (anti-nausea medicine) to be taken every 8 hours as needed for nausea.   Please follow up in 1 week if  symptoms persist or worsen. Please call the clinic immediately if you have any concerns.   Our clinic's number is [phone]. Please call with questions or concerns.   Thank you,  Oralia ManisSherin Jaleeya Mcnelly, DO

## 2017-10-05 NOTE — Progress Notes (Signed)
Addendum:  Patient's flu swab positive for Influenza A. Patient informed of results. Will continue current treatment plan as patient is out of window for tamiflu. Advised on proper hand hygiene. Advise to avoiding increased contact with others.   Orpah ClintonSherin Joselito Fieldhouse, DO, PGY-1 [facility] 10/05/2017 2:15 PM

## 2017-10-05 NOTE — Progress Notes (Signed)
   Subjective:    Patient ID: Jane Marshall, female    DOB: [birth year], 32 y.o.   MRN: [id number]   CC: cough, fever, chills, nausea/vomiting, muscle aches   HPI: Fever/cough/chills/nausea&vomiting/muscle aches/diarrhea Patient reports having viral symptoms beginning on Tuesday. Symptoms first began with a fever and chills. Patient states having temperature of 101.5F daily since Tuesday. Patient also reports muscle aches since Tuesday that have not been relieved with daily ibuprofen use. Patient has not tried tylenol. Patient on Wednesday began having vomiting and diarrhea and has not been able to keep anything down, even water, since. Patient endorses sore throat and brown/green mucous. Patient also endorses cough and chest pain after coughing. States sometimes it is difficult to breath due to chest pain. Patient has exposure to kids over the weekend and has 2 children at home who have had a cough but no fever. Patient did not get her flu shot this year as she was still deciding at last appointment whether or not she should receive one.   Objective:  BP 120/80 (BP Location: Left Arm, Patient Position: Sitting, Cuff Size: Large)   Pulse 91   Temp 98.9 F (37.2 C) (Oral)   Ht 5\' 7"  (1.702 m)   Wt 206 lb (93.4 kg)   LMP 09/01/2017   SpO2 99%   BMI 32.26 kg/m  Vitals and nursing note reviewed  General: well nourished, wearing mask  HEENT: normocephalic, TM's visualized bilaterally Neck: supple, non-tender, without lymphadenopathy Cardiac: RRR, clear S1 and S2, no murmurs, rubs, or gallops Respiratory: clear to auscultation bilaterally, no increased work of breathing Abdomen: soft, nontender, nondistended, no masses or organomegaly. Bowel sounds present Extremities: no edema or cyanosis. Warm, well perfused. 2+ radial pulses bilaterally Skin: warm and dry, no rashes noted Neuro: alert and oriented, no focal deficits   Assessment & Plan:    Viral URI with cough Patient with symptoms  of likely viral etiology since Tuesday. Afebrile in office but has measured fever at home. Lungs CTAB on exam, no wheezing noted.  -flu swab obtained. Patient is out of window for treatment with Tamiflu but patient stated she would like to definitively know whether or not she had flu  -encouraged OTC tylenol for fever and muscle aches as ibuprofen has not been beneficial. Recommended alternating between the two  -zofran prn  -encouraged oral hydration with pedialyte or gatorade to replenish electrolytes  -follow up in 1 week if no improvement     Return in about 1 week (around 10/12/2017).   Oralia ManisSherin Sriram Febles, DO, PGY-1

## 2017-10-07 ENCOUNTER — Encounter: Payer: Self-pay | Admitting: Family Medicine

## 2017-10-07 NOTE — Telephone Encounter (Signed)
Letter written to be out of work till 10/15/17. I am unable to print letter as I am not in clinic. Patient may have letter printed and picked up at her convenience.   Thank you,  Oralia ManisSherin Dinna Severs, DO

## 2017-10-08 ENCOUNTER — Encounter: Payer: Self-pay | Admitting: *Deleted

## 2017-10-08 NOTE — Telephone Encounter (Signed)
Patient needed note to state she was out of work from 10/03/17 through 10/05/17. Note amended and put at front for patient to pick up. Patient aware. Ples SpecterAlisa Katlyne Nishida, RN [facility] RN)

## 2017-10-11 ENCOUNTER — Telehealth: Payer: Self-pay

## 2017-10-11 NOTE — Telephone Encounter (Signed)
Patient left message on nurse line that she is having a flare-up of eczema and is having intense itching. She has been using hydrocortisone cream without relief and wanted to know if her PCP had other ideas to treat. Ples SpecterAlisa Anesia Blackwell, RN [facility] RN)

## 2017-10-11 NOTE — Telephone Encounter (Signed)
Would advise patient to schedule appointment to reevaluate eczema.  Would be more than happy to prescribe more potent steroid if necessary.  Durward Parcelavid McMullen, DO [facility], PGY-2

## 2017-10-16 ENCOUNTER — Encounter: Payer: Self-pay | Admitting: Internal Medicine

## 2017-10-16 ENCOUNTER — Ambulatory Visit (INDEPENDENT_AMBULATORY_CARE_PROVIDER_SITE_OTHER): Payer: BC Managed Care – PPO | Admitting: Internal Medicine

## 2017-10-16 VITALS — BP 120/70 | HR 80 | Temp 98.1°F | Ht 67.0 in | Wt 213.6 lb

## 2017-10-16 DIAGNOSIS — L509 Urticaria, unspecified: Secondary | ICD-10-CM

## 2017-10-16 MED ORDER — RANITIDINE HCL 150 MG PO CAPS: 150.0000 mg | ORAL_CAPSULE | Freq: Two times a day (BID) | ORAL | 0 refills | Status: DC | PRN

## 2017-10-16 MED ORDER — PREDNISONE 10 MG (21) PO TBPK: ORAL_TABLET | ORAL | 0 refills | Status: DC

## 2017-10-16 NOTE — Patient Instructions (Signed)
Ms. Jane Marshall,  Please take prednisone daily for the next 6 days. Take zantac twice daily with benadryl as needed for itching. If no improvement, please make an appointment to see us back.  Best, Dr. Sampson GoonFitzgerald   Hives Hives (urticaria) are itchy, red, swollen areas on your skin. Hives can show up on any part of your body, and they can vary in size. They can be as small as the tip of a pen or much larger. Hives often fade within 24 hours (acute hives). In other cases, new hives show up after old ones fade. This can continue for many days or weeks (chronic hives). Hives are caused by your body's reaction to an irritant or to something that you are allergic to (trigger). You can get hives right after being around a trigger or hours later. Hives do not spread from person to person (are not contagious). Hives may get worse if you scratch them, if you exercise, or if you have worries (emotional stress). Follow these instructions at home: Medicines  Take or apply over-the-counter and prescription medicines only as told by your doctor.  If you were prescribed an antibiotic medicine, use it as told by your doctor. Do not stop taking the antibiotic even if you start to feel better. Skin Care  Apply cool, wet cloths (cool compresses) to the itchy, red, swollen areas.  Do not scratch your skin. Do not rub your skin. General instructions  Do not take hot showers or baths. This can make itching worse.  Do not wear tight clothes.  Use sunscreen and wear clothing that covers your skin when you are outside.  Avoid any triggers that cause your hives. Keep a journal to help you keep track of what causes your hives. Write down: ? What medicines you take. ? What you eat and drink. ? What products you use on your skin.  Keep all follow-up visits as told by your doctor. This is important. Contact a doctor if:  Your symptoms are not better with medicine.  Your joints are painful or swollen. Get help  right away if:  You have a fever.  You have belly pain.  Your tongue or lips are swollen.  Your eyelids are swollen.  Your chest or throat feels tight.  You have trouble breathing or swallowing. These symptoms may be an emergency. Do not wait to see if the symptoms will go away. Get medical help right away. Call your local emergency services (911 in the [place] Do not drive yourself to the hospital. This information is not intended to replace advice given to you by your health care provider. Make sure you discuss any questions you have with your health care provider. Document Released: 06/06/2008 Document Revised: 02/03/2016 Document Reviewed: 06/16/2015 Elsevier Interactive Patient Education  2018 [employer]

## 2017-10-19 NOTE — Progress Notes (Signed)
[facility] Progress Note  Subjective:  Jane Marshall is a 32 y.o. female with history of reflux and eczema who presents for worsening rash. She reports symptoms started about 3 days after she was diagnosed with the flu on the 25th of last month. She has itchy spots all over her body that are making it difficult to sleep. Benadryl helps some with itching. They are particularly bothersome over nipples. She continues to see new red areas pop up. They start out looking red and slightly raised and then dry out and are somewhat scaly. She denies new soaps or detergents. No pets. No recent travel. No one else with rash around her. She did not receive tamiflu because she was outside of treatment window but did take tylenol, ibuprofen, an OTC decongestant, and tessalon perles. She started taking paxil last month but has been tolerating well. ROS: No shortness of breath; fevers have resolved   No Known Allergies  Social History   Tobacco Use  . Smoking status: Former Smoker    Packs/day: 0.25    Types: Cigarettes, Cigars    Last attempt to quit: 09/12/2011    Years since quitting: 6.1  . Smokeless tobacco: Never Used  Substance Use Topics  . Alcohol use: Yes    Alcohol/week: 8.4 oz    Types: 14 Cans of beer per week    Comment: Binges on Weekend. Endorses sometimes needing "eye opener."    Objective: Blood pressure 120/70, pulse 80, temperature 98.1 F (36.7 C), temperature source Oral, height 5\' 7"  (1.702 m), weight 213 lb 9.6 oz (96.9 kg), SpO2 99 %. Body mass index is 33.45 kg/m. Constitutional: Pleasant, obese female in NAD HENT: MMM, normal posterior oropharynx Cardiovascular: RRR, S1, S2, no m/r/g.  Pulmonary/Chest: Effort normal and breath sounds normal.  Abdominal: Soft. +BS, NT Musculoskeletal: No LE edema Skin: Diffuse erythematous rash with macules and scattered dry, flaky patches; some obvious hives present in antecubital fossae; distribution most concentrated  across arms, upper chest and thighs; but also present across back and abdomen Psychiatric: Normal mood and affect.  Vitals reviewed  Assessment/Plan: Urticarial rash - Unclear trigger. Scaling of some lesions but likely secondary to scratching. Newer lesions consistent with hives. Was diffuse from beginning and no herald patch to suggest pityriasis rosea. Precepted with Dr. Leveda AnnaHensel given unusual presentation. Unlikely drug eruption rash, as did not receive tamiflu. - Recommended short prednisone taper given extent of rash.  - Recommended adding zantac to benadryl to help with itching. - To continue to consider any possible triggers (foods, lotions)  Meds ordered this encounter  Medications  . ranitidine (ZANTAC) 150 MG capsule    Sig: Take 1 capsule (150 mg total) by mouth 2 (two) times daily as needed (itching).    Dispense:  30 capsule    Refill:  0  . predniSONE (STERAPRED UNI-PAK 21 TAB) 10 MG (21) TBPK tablet    Sig: Take 6 tablets with breakfast the first day, then 5, then 4, then 3, then 2 then 1 on subsequent days.    Dispense:  21 tablet    Refill:  0     Follow-up if no improvement by next week.   Dani GobbleHillary Jaishawn Witzke, MD [facility], PGY-3

## 2017-10-20 ENCOUNTER — Encounter: Payer: Self-pay | Admitting: Internal Medicine

## 2017-10-20 DIAGNOSIS — L509 Urticaria, unspecified: Secondary | ICD-10-CM | POA: Insufficient documentation

## 2017-10-20 NOTE — Assessment & Plan Note (Addendum)
-   Unclear trigger. Scaling of some lesions but likely secondary to scratching. Newer lesions consistent with hives. Was diffuse from beginning and no herald patch to suggest pityriasis rosea. Precepted with Dr. Leveda AnnaHensel given unusual presentation. Unlikely drug eruption rash, as did not receive tamiflu. - Recommended short prednisone taper given extent of rash.  - Recommended adding zantac to benadryl to help with itching. - To continue to consider any possible triggers (foods, lotions)  Meds ordered this encounter  Medications  . ranitidine (ZANTAC) 150 MG capsule    Sig: Take 1 capsule (150 mg total) by mouth 2 (two) times daily as needed (itching).    Dispense:  30 capsule    Refill:  0  . predniSONE (STERAPRED UNI-PAK 21 TAB) 10 MG (21) TBPK tablet    Sig: Take 6 tablets with breakfast the first day, then 5, then 4, then 3, then 2 then 1 on subsequent days.    Dispense:  21 tablet    Refill:  0

## 2017-12-02 ENCOUNTER — Other Ambulatory Visit: Payer: Self-pay | Admitting: Internal Medicine

## 2017-12-24 ENCOUNTER — Other Ambulatory Visit: Payer: Self-pay | Admitting: Student in an Organized Health Care Education/Training Program

## 2018-01-27 ENCOUNTER — Other Ambulatory Visit: Payer: Self-pay | Admitting: Family Medicine

## 2018-06-11 ENCOUNTER — Ambulatory Visit: Payer: BC Managed Care – PPO | Admitting: Family Medicine

## 2018-06-11 ENCOUNTER — Other Ambulatory Visit: Payer: Self-pay

## 2018-06-11 VITALS — BP 100/68 | HR 76 | Temp 98.6°F | Ht 67.0 in | Wt 215.6 lb

## 2018-06-11 DIAGNOSIS — M7918 Myalgia, other site: Secondary | ICD-10-CM | POA: Diagnosis not present

## 2018-06-11 DIAGNOSIS — F322 Major depressive disorder, single episode, severe without psychotic features: Secondary | ICD-10-CM | POA: Diagnosis not present

## 2018-06-11 MED ORDER — PAROXETINE HCL 20 MG PO TABS: 20.0000 mg | ORAL_TABLET | Freq: Every day | ORAL | 3 refills | Status: DC

## 2018-06-11 MED ORDER — CYCLOBENZAPRINE HCL 10 MG PO TABS: 10.0000 mg | ORAL_TABLET | Freq: Three times a day (TID) | ORAL | 0 refills | Status: DC | PRN

## 2018-06-11 NOTE — Patient Instructions (Addendum)
It was a pleasure to see you today! Thank you for choosing [facility] for your primary care. Jane Marshall was seen for back pain.   Our plans for today were:  Take ibuprofen and tylenol as you have been. You can keep using heat.   I have sent a muscle relaxer for you to try, please don't drive while taking this.   I will send a refill for your anxiety medicine too.   You should come back and see [place] ASAP for your PAP smear and flu shot.   Best,  Dr. Chanetta Marshall

## 2018-06-11 NOTE — Assessment & Plan Note (Signed)
Refill paxil, she feels this is very helpful for her.

## 2018-06-11 NOTE — Progress Notes (Signed)
   CC: back pain  HPI  Back pain - started Friday. Has had some leg cramps as well predating this. Now it is "unbearable." can't stand or sit 2/2 pain. Has tried ibuprofen, APAP, heating pad (this has helped the most). Never had this before per her report. No back surgery, no trauma. No hx of back imaging. She emptied more trash than normal on Friday, no other new exercise routines. No fever, no bowel changes.   Needs refill on anxiety medicine. Has been on paxil since 09/2017. This is doing well for her. She feels she can focus better now, and has improved "beating myself down over things I can't control."   ROS: Denies CP, SOB, abdominal pain, dysuria, changes in BMs.   CC, SH/smoking status, and VS noted  Objective: BP 100/68   Pulse 76   Temp 98.6 F (37 C) (Oral)   Ht 5\' 7"  (1.702 m)   Wt 215 lb 9.6 oz (97.8 kg)   LMP 05/13/2018 (Exact Date)   SpO2 99%   BMI 33.77 kg/m  Gen: NAD, alert, cooperative, and pleasant. HEENT: NCAT, EOMI, PERRL CV: RRR, no murmur Resp: CTAB, no wheezes, non-labored Abd: SNTND, BS present, no guarding or organomegaly Ext: No edema, warm. Paraspinal lumbar back pain, no spinous process tenderness. Normal hip ROM.  Neuro: Alert and oriented, Speech clear, No gross deficits  Assessment and plan:  Depression Refill paxil, she feels this is very helpful for her.   Lumbar back pain: continue conservative therapy with scheduled ibuprofen, APAP,a nd heat. Add flexeril if needed. Don't drive while taking this. Return if no improvement.   No orders of the defined types were placed in this encounter.   Meds ordered this encounter  Medications  . cyclobenzaprine (FLEXERIL) 10 MG tablet    Sig: Take 1 tablet (10 mg total) by mouth 3 (three) times daily as needed for muscle spasms.    Dispense:  30 tablet    Refill:  0  . PARoxetine (PAXIL) 20 MG tablet    Sig: Take 1 tablet (20 mg total) by mouth daily.    Dispense:  90 tablet    Refill:  3     Health Maintenance reviewed - patient asked to return for PAP, she will consider flu shot at that visit.  Loni Muse, MD, PGY3 06/11/2018 5:01 PM

## 2018-10-22 ENCOUNTER — Ambulatory Visit: Payer: BC Managed Care – PPO | Admitting: Student in an Organized Health Care Education/Training Program

## 2018-11-12 ENCOUNTER — Ambulatory Visit
Admission: EM | Admit: 2018-11-12 | Discharge: 2018-11-12 | Disposition: A | Discharge status: Home / Self Care | Payer: BC Managed Care – PPO | Attending: Physician Assistant | Admitting: Physician Assistant

## 2018-11-12 DIAGNOSIS — M25561 Pain in right knee: Secondary | ICD-10-CM

## 2018-11-12 DIAGNOSIS — M722 Plantar fascial fibromatosis: Secondary | ICD-10-CM | POA: Diagnosis not present

## 2018-11-12 HISTORY — DX: Plantar fascial fibromatosis: M72.2

## 2018-11-12 MED ORDER — PREDNISONE 50 MG PO TABS: 50.0000 mg | ORAL_TABLET | Freq: Every day | ORAL | 0 refills | Status: DC

## 2018-11-12 NOTE — ED Triage Notes (Signed)
Pt c/o plantar fascitis to lt foot x5days and now having pain to rt knee

## 2018-11-12 NOTE — ED Provider Notes (Signed)
[facility]    CSN: [id number] Arrival date & time: 11/12/18  1118     History   Chief Complaint Chief Complaint  Patient presents with  . Foot Pain    HPI Jane Marshall is a 33 y.o. female.   33 year old female comes in for left foot/heel pain and right knee pain.  Patient states has been dealing with plantar fasciitis for the past 2 years, 5 days ago had a flareup to the left foot. She denies any injury/trauma, increase in activity. Pain to the proximal plantar surface and heel, worse with weightbearing. Denies swelling, erythema, warmth. Has been using tennis ball, stretches, ice compress without relief. Ibuprofen 400mg  Q4H for the past 5 days without relief. States she in the process of trying to avoid bearing weight to the left foot, she twisted her knee and now have pain to bilateral side of knee. Denies falling. Denies swelling.      Past Medical History:  Diagnosis Date  . Allergy   . Anemia    high school  . Exotropia of left eye   . GERD (gastroesophageal reflux disease)   . H. pylori infection   . Headache(784.0)   . Plantar fasciitis     Patient Active Problem List   Diagnosis Date Noted  . Urticarial rash 10/20/2017  . Depression 08/24/2017  . Suicidal ideation 08/13/2017  . Viral URI with cough 08/13/2017  . Screen for STD (sexually transmitted disease) 04/18/2017  . Birth control counseling 04/18/2017  . Vaginal pruritus 02/07/2017  . 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 Elevated prolactin level (HCC) 01/02/2017  . Amenorrhea 12/27/2016  . Fatigue 12/27/2016  . High risk sexual behavior 12/27/2016  . GERD (gastroesophageal reflux disease) 12/15/2016  . Dysphagia 12/05/2016  . Chronic pain of right heel 10/26/2016  . Eczema 10/16/2014  . 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 corrective foot surgery Bilateral   . wisdom teeth      OB History   No obstetric history on file.       Home Medications    Prior to Admission medications   Medication Sig Start Date End Date Taking? Authorizing Provider  guaiFENesin 200 MG tablet Take 1 tablet (200 mg total) by mouth every 8 (eight) hours as needed for cough or to loosen phlegm. 08/23/17   Marquette Saa, MD  hydrocortisone 1 % ointment Apply 1 application topically 2 (two) times daily. 01/18/17   Beaulah Dinning, MD  nitroGLYCERIN (NITRODUR - DOSED IN MG/24 HR) 0.2 mg/hr patch Place 1 patch (0.2 mg total) onto the skin daily. 02/07/17   Wendee Beavers, DO  PARoxetine (PAXIL) 20 MG tablet Take 1 tablet (20 mg total) by mouth daily. 06/11/18   Garth Bigness, MD  predniSONE (DELTASONE) 50 MG tablet Take 1 tablet (50 mg total) by mouth daily with breakfast. 11/12/18   Belinda Fisher, PA-C    Family History Family History  Problem Relation Age of Onset  . Early death Mother   . Heart disease Mother   . Kidney disease Mother   . Cancer Father        pancreatic  . Pancreatic cancer Father   . Crohn's disease Sister   . Stomach cancer Maternal Aunt   . Colon cancer Neg Hx   . Colon polyps Neg Hx   . Esophageal cancer Neg Hx   . Rectal cancer Neg Hx     Social History  Social History   Tobacco Use  . Smoking status: Former Smoker    Packs/day: 0.25    Types: Cigarettes, Cigars    Last attempt to quit: 09/12/2011    Years since quitting: 7.1  . Smokeless tobacco: Never Used  Substance Use Topics  . Alcohol use: Yes    Alcohol/week: 14.0 standard drinks    Types: 14 Cans of beer per week    Comment: Binges on Weekend. Endorses sometimes needing "eye opener."  . Drug use: No     Allergies   Patient has no known allergies.   Review of Systems Review of Systems  Reason unable to perform ROS: See HPI as above.     Physical Exam Triage Vital Signs ED Triage Vitals [11/12/18 1141]  Enc Vitals Group     BP 122/78     Pulse Rate 75     Resp 18     Temp 98.9 F (37.2 C)     Temp  Source Oral     SpO2 97 %     Weight      Height      Head Circumference      Peak Flow      Pain Score 10     Pain Loc      Pain Edu?      Excl. in GC?    No data found.  Updated Vital Signs BP 122/78 (BP Location: Left Arm)   Pulse 75   Temp 98.9 F (37.2 C) (Oral)   Resp 18   LMP 10/18/2018   SpO2 97%   Physical Exam Constitutional:      General: She is not in acute distress.    Appearance: She is well-developed. She is not diaphoretic.  HENT:     Head: Normocephalic and atraumatic.  Eyes:     Conjunctiva/sclera: Conjunctivae normal.     Pupils: Pupils are equal, round, and reactive to light.  Musculoskeletal:     Comments: No swelling, erythema, warmth, contusion seen.  Tenderness to palpation of proximal left plantar surface, and along the Achilles tendon.  Full range of motion of ankle, worse with dorsiflexion.  Strength deferred.  Sensation intact and equal bilaterally.  Pedal pulse 2+, cap refill less than 2 seconds.  No swelling, erythema, warmth, contusion seen of the right knee.  No tenderness to palpation.  Full range of motion, strength normal and equal bilaterally.  Sensation intact and equal bilaterally.  Neurological:     Mental Status: She is alert and oriented to person, place, and time.      UC Treatments / Results  Labs (all labs ordered are listed, but only abnormal results are displayed) Labs Reviewed - No data to display  EKG None  Radiology No results found.  Procedures Procedures (including critical care time)  Medications Ordered in UC Medications - No data to display  Initial Impression / Assessment and Plan / UC Course  I have reviewed the triage vital signs and the nursing notes.  Pertinent labs & imaging results that were available during my care of the patient were reviewed by me and considered in my medical decision making (see chart for details).    Patient has been taking NSAIDs without relief.  Will switch to  prednisone for plantar fasciitis.  Other symptomatic treatment discussed.  Will provide postop boot to help with plantar fasciitis.  Knee sleeve during activity for the right knee.  Return precautions given.  Patient expresses understanding and agrees to plan.  Final Clinical Impressions(s) / UC Diagnoses   Final diagnoses:  Plantar fasciitis of left foot  Acute pain of right knee    ED Prescriptions    Medication Sig Dispense Auth. Provider   predniSONE (DELTASONE) 50 MG tablet Take 1 tablet (50 mg total) by mouth daily with breakfast. 5 tablet Threasa Alpha, [place] 11/12/18 1312

## 2018-11-12 NOTE — Discharge Instructions (Signed)
Start prednisone as directed. Continue ice compress, elevation, stretches. You can wear post op boot for activity to help with symptoms. Knee sleeve during activity. Follow up with sports medicine/orthopedics if symptoms still not improving.

## 2019-01-24 ENCOUNTER — Encounter: Payer: Self-pay | Admitting: Physician Assistant

## 2019-01-24 ENCOUNTER — Ambulatory Visit
Admission: EM | Admit: 2019-01-24 | Discharge: 2019-01-24 | Disposition: A | Discharge status: Home / Self Care | Payer: BC Managed Care – PPO | Attending: Physician Assistant | Admitting: Physician Assistant

## 2019-01-24 ENCOUNTER — Other Ambulatory Visit: Payer: Self-pay

## 2019-01-24 DIAGNOSIS — K0889 Other specified disorders of teeth and supporting structures: Secondary | ICD-10-CM | POA: Diagnosis not present

## 2019-01-24 DIAGNOSIS — L731 Pseudofolliculitis barbae: Secondary | ICD-10-CM | POA: Diagnosis not present

## 2019-01-24 DIAGNOSIS — R21 Rash and other nonspecific skin eruption: Secondary | ICD-10-CM | POA: Diagnosis not present

## 2019-01-24 MED ORDER — CLOTRIMAZOLE-BETAMETHASONE 1-0.05 % EX CREA: TOPICAL_CREAM | CUTANEOUS | 0 refills | Status: DC

## 2019-01-24 MED ORDER — AMOXICILLIN-POT CLAVULANATE 875-125 MG PO TABS: 1.0000 | ORAL_TABLET | Freq: Two times a day (BID) | ORAL | 0 refills | Status: DC

## 2019-01-24 MED ORDER — MUPIROCIN 2 % EX OINT: 1.0000 "application " | TOPICAL_OINTMENT | Freq: Two times a day (BID) | CUTANEOUS | 0 refills | Status: DC

## 2019-01-24 NOTE — ED Notes (Signed)
Patient able to ambulate independently  

## 2019-01-24 NOTE — Discharge Instructions (Signed)
Start Augmentin as directed for dental infection. Tylenol/motrin for pain. Follow up with dentist for further treatment and evaluation. If experiencing swelling of the throat, trouble breathing, trouble swallowing, leaning forward to breath, drooling, go to the emergency department for further evaluation.   Augmentin will take care of infection for the ingrown hair/hair follicle infection. Warm compress to the area. Monitor for spreading redness, increased warmth, fever.  Lotrisone for rash. As discussed, may also be callus causing irritation. Can use over the counter salicylic acid to help soften callus. Follow up with PCP for further evaluation if symptoms not improving.

## 2019-01-24 NOTE — ED Triage Notes (Addendum)
Pt presents to [facility] for assessment of left foot rash (darkening of skin) that patient states is itchy and she is "going to scratch a hole in my foot".  Pt also presents for left lower dental pain worsening Wednesday of this week with throbbing pain radiating to ears.  Pt states she believes she has a cavity there and a broken tooth.  Pt states she also had three boils to her inner upper thight which she has applied heat to, and states they are not going away.

## 2019-01-24 NOTE — ED Provider Notes (Signed)
[facility]    CSN: [id number] Arrival date & time: 01/24/19  1423     History   Chief Complaint Chief Complaint  Patient presents with  . Dental Pain    HPI Jane Marshall is a 33 y.o. female.   33 year old female comes in for multiple complaints.  1. Left foot rash for the past few days. States it is circular and erythematous when it first started out. However, now with more hyperpigmentation. Area is pruritic in nature. Denies spreading erythema, warmth, fever. Denies pain. Denies new contact. Denies ill fitted shoe. Has tried lotrimin without relief.   2. 1 week history of left lower dental pain. States had a filling fall out, and believes it is now cracked. Has had increased swelling with pain that is improved warm compress. Now with pain radiating to the ear. Denies swelling of the throat, trouble breathing, trouble swallowing, tripoding, drooling trismus.  3. Has "boils" to the left inner thigh that is tender to palpation. States it has been there for awhile. Denies spreading erythema, warmth. Denies increase in size. Denies shaving. She wonders if friction can cause it due to location. Has done warm compress without relief.      Past Medical History:  Diagnosis Date  . Allergy   . Anemia    high school  . Exotropia of left eye   . GERD (gastroesophageal reflux disease)   . H. pylori infection   . Headache(784.0)   . Plantar fasciitis     Patient Active Problem List   Diagnosis Date Noted  . Urticarial rash 10/20/2017  . Depression 08/24/2017  . Suicidal ideation 08/13/2017  . Viral URI with cough 08/13/2017  . Screen for STD (sexually transmitted disease) 04/18/2017  . Birth control counseling 04/18/2017  . Vaginal pruritus 02/07/2017  . 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 Elevated prolactin level (HCC) 01/02/2017  . Amenorrhea 12/27/2016  . Fatigue 12/27/2016  . High risk sexual behavior 12/27/2016  . GERD (gastroesophageal reflux disease)  12/15/2016  . Dysphagia 12/05/2016  . Chronic pain of right heel 10/26/2016  . Eczema 10/16/2014  . 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 corrective foot surgery Bilateral   . wisdom teeth      OB History   No obstetric history on file.      Home Medications    Prior to Admission medications   Medication Sig Start Date End Date Taking? Authorizing Provider  amoxicillin-clavulanate (AUGMENTIN) 875-125 MG tablet Take 1 tablet by mouth every 12 (twelve) hours. 01/24/19   Belinda Fisher, PA-C  clotrimazole-betamethasone (LOTRISONE) cream Apply to affected area 2 times daily prn 01/24/19   Cathie Hoops, Emanie Behan V, PA-C  guaiFENesin 200 MG tablet Take 1 tablet (200 mg total) by mouth every 8 (eight) hours as needed for cough or to loosen phlegm. 08/23/17   Marquette Saa, MD  hydrocortisone 1 % ointment Apply 1 application topically 2 (two) times daily. 01/18/17   Beaulah Dinning, MD  mupirocin ointment (BACTROBAN) 2 % Apply 1 application topically 2 (two) times daily. 01/24/19   Cathie Hoops, Gotham Raden V, PA-C  nitroGLYCERIN (NITRODUR - DOSED IN MG/24 HR) 0.2 mg/hr patch Place 1 patch (0.2 mg total) onto the skin daily. 02/07/17   Wendee Beavers, DO  PARoxetine (PAXIL) 20 MG tablet Take 1 tablet (20 mg total) by mouth daily. 06/11/18   Garth Bigness, MD    Family History Family History  Problem Relation Age of Onset  . Early death Mother   . Heart disease Mother   . Kidney disease Mother   . Cancer Father        pancreatic  . Pancreatic cancer Father   . Crohn's disease Sister   . Stomach cancer Maternal Aunt   . Colon cancer Neg Hx   . Colon polyps Neg Hx   . Esophageal cancer Neg Hx   . Rectal cancer Neg Hx     Social History Social History   Tobacco Use  . Smoking status: Former Smoker    Packs/day: 0.25    Types: Cigarettes, Cigars    Last attempt to quit: 09/12/2011    Years since quitting: 7.3  . Smokeless tobacco: Never  Used  Substance Use Topics  . Alcohol use: Yes    Alcohol/week: 14.0 standard drinks    Types: 14 Cans of beer per week    Comment: Binges on Weekend. Endorses sometimes needing "eye opener."  . Drug use: No     Allergies   Patient has no known allergies.   Review of Systems Review of Systems  Reason unable to perform ROS: See HPI as above.     Physical Exam Triage Vital Signs ED Triage Vitals [01/24/19 1434]  Enc Vitals Group     BP (!) 153/88     Pulse Rate 76     Resp 18     Temp 99 F (37.2 C)     Temp src      SpO2 99 %     Weight      Height      Head Circumference      Peak Flow      Pain Score 8     Pain Loc      Pain Edu?      Excl. in GC?    No data found.  Updated Vital Signs BP (!) 153/88 (BP Location: Left Arm)   Pulse 76   Temp 99 F (37.2 C)   Resp 18   SpO2 99%   Physical Exam Constitutional:      General: She is not in acute distress.    Appearance: She is well-developed. She is not ill-appearing, toxic-appearing or diaphoretic.  HENT:     Head: Normocephalic and atraumatic.     Jaw: No trismus.     Left Ear: Tympanic membrane, ear canal and external ear normal. Tympanic membrane is not erythematous or bulging.     Mouth/Throat:     Mouth: Mucous membranes are moist.     Pharynx: Oropharynx is clear. Uvula midline. No uvula swelling.     Tonsils: No tonsillar exudate.      Comments: Tenderness to palpation around cracked tooth. No swelling, fluctuance.  Floor of mouth soft to palpation. No facial swelling.  Neck:     Musculoskeletal: Normal range of motion and neck supple.  Genitourinary:    Comments: Few folliculitis to the left medial thigh. No obvious tenderness to palpation. No erythema, warmth. No fluctuance felt.  Skin:    General: Skin is warm and dry.     Comments: See picture below. No tenderness to palpation. Skin thickening with hyperpigmentation. ?Callus vs rash.  Neurological:     Mental Status: She is alert and  oriented to person, place, and time.        UC Treatments / Results  Labs (all labs ordered are listed, but only abnormal results are displayed) Labs Reviewed - No  data to display  EKG None  Radiology No results found.  Procedures Procedures (including critical care time)  Medications Ordered in UC Medications - No data to display  Initial Impression / Assessment and Plan / UC Course  I have reviewed the triage vital signs and the nursing notes.  Pertinent labs & imaging results that were available during my care of the patient were reviewed by me and considered in my medical decision making (see chart for details).    Start antibiotics for possible dental infection. Symptomatic treatment as needed. Discussed with patient symptoms can return if dental problem is not addressed. Follow up with dentist for further evaluation and treatment of dental pain. Resources given. Return precautions given.   Discussed augmentin will help with skin infction with folliculitis vs ingrown hair. Will have patient continue warm compress. Return precautions given.  Discussed callus vs rash. However, given itching will cover with cortisone cream. Given circular rash, ? Possible fungal infection. Therefore lotrisone provided. Patient to continue to monitor, follow up with PCP if symptoms not improving. Return precautions given. Patient expresses understanding and agrees to plan.  Final Clinical Impressions(s) / UC Diagnoses   Final diagnoses:  Pain, dental  Ingrown hair  Rash   ED Prescriptions    Medication Sig Dispense Auth. Provider   amoxicillin-clavulanate (AUGMENTIN) 875-125 MG tablet Take 1 tablet by mouth every 12 (twelve) hours. 14 tablet Willadeen Colantuono V, PA-C   mupirocin ointment (BACTROBAN) 2 % Apply 1 application topically 2 (two) times daily. 22 g Ruthy Forry V, PA-C   clotrimazole-betamethasone (LOTRISONE) cream Apply to affected area 2 times daily prn 15 g Threasa AlphaYu, Danelle Curiale V, PA-C       Brei Pociask,  Jocelin Schuelke V, [place]PA-C 01/24/19 1507

## 2019-02-04 ENCOUNTER — Other Ambulatory Visit: Payer: Self-pay

## 2019-02-04 ENCOUNTER — Ambulatory Visit
Admission: EM | Admit: 2019-02-04 | Discharge: 2019-02-04 | Disposition: A | Discharge status: Home / Self Care | Payer: BC Managed Care – PPO | Attending: Physician Assistant | Admitting: Physician Assistant

## 2019-02-04 ENCOUNTER — Ambulatory Visit (INDEPENDENT_AMBULATORY_CARE_PROVIDER_SITE_OTHER): Payer: BC Managed Care – PPO

## 2019-02-04 ENCOUNTER — Encounter: Payer: Self-pay | Admitting: Emergency Medicine

## 2019-02-04 DIAGNOSIS — Y93K1 Activity, walking an animal: Secondary | ICD-10-CM | POA: Diagnosis not present

## 2019-02-04 DIAGNOSIS — W19XXXA Unspecified fall, initial encounter: Secondary | ICD-10-CM | POA: Diagnosis not present

## 2019-02-04 DIAGNOSIS — M545 Low back pain, unspecified: Secondary | ICD-10-CM

## 2019-02-04 DIAGNOSIS — W010XXA Fall on same level from slipping, tripping and stumbling without subsequent striking against object, initial encounter: Secondary | ICD-10-CM | POA: Diagnosis not present

## 2019-02-04 DIAGNOSIS — M7918 Myalgia, other site: Secondary | ICD-10-CM

## 2019-02-04 MED ORDER — PREDNISONE 50 MG PO TABS: 50.0000 mg | ORAL_TABLET | Freq: Every day | ORAL | 0 refills | Status: DC

## 2019-02-04 MED ORDER — METHOCARBAMOL 500 MG PO TABS: 500.0000 mg | ORAL_TABLET | Freq: Two times a day (BID) | ORAL | 0 refills | Status: DC

## 2019-02-04 MED ORDER — KETOROLAC TROMETHAMINE 30 MG/ML IJ SOLN
30.0000 mg | Freq: Once | INTRAMUSCULAR | Status: AC
  Administered 2019-02-04: 30 mg via INTRAMUSCULAR

## 2019-02-04 NOTE — ED Triage Notes (Signed)
Pt presents to [facility] for assessment of left hip, buttocks, groin and leg pain after she slipped in mud and did a split on Wednesday of last week.

## 2019-02-04 NOTE — Discharge Instructions (Signed)
Xray negative for fracture, dislocation. Start prednisone as directed. Robaxin as needed, this can make you drowsy, so do not take if you are going to drive, operate heavy machinery, or make important decisions. Ice/heat compresses as needed. This can take up to 3-4 weeks to completely resolve, but you should be feeling better each week. Follow up here or with PCP if symptoms worsen, changes for reevaluation. If experience numbness/tingling of the inner thighs, loss of bladder or bowel control, go to the emergency department for evaluation.

## 2019-02-04 NOTE — ED Provider Notes (Signed)
[facility]    CSN: [id number] Arrival date & time: 02/04/19  1120     History   Chief Complaint Chief Complaint  Patient presents with  . Leg Pain    [name] a 33 y.o. female.   33 year old female comes in with left hip, buttocks, leg pain after injury 6 days ago. She was walking her dog, slipped in mud and fell. She landed with her right leg to the back and left leg forward, similar to a split. Since then, she has had pain to the left lower back and left buttocks. Pain radiates to the groin, down the thigh diffusely when ambulating. She has tried to avoid bearing weight, with limping/jumping and using crutches when using the restroom, otherwise, she has been resting in bed. She denies saddle anesthesia, loss of bladder or bowel control. She has been icing with temporary relief and taking ibuprofen 400mg  Q4H. States ibuprofen seemed to have helped the first few days, but pain is now worse.     Past Medical History:  Diagnosis Date  . Allergy   . Anemia    high school  . Exotropia of left eye   . GERD (gastroesophageal reflux disease)   . H. pylori infection   . Headache(784.0)   . Plantar fasciitis     Patient Active Problem List   Diagnosis Date Noted  . Urticarial rash 10/20/2017  . Depression 08/24/2017  . Suicidal ideation 08/13/2017  . Viral URI with cough 08/13/2017  . Screen for STD (sexually transmitted disease) 04/18/2017  . Birth control counseling 04/18/2017  . Vaginal pruritus 02/07/2017  . 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 Elevated prolactin level (HCC) 01/02/2017  . Amenorrhea 12/27/2016  . Fatigue 12/27/2016  . High risk sexual behavior 12/27/2016  . GERD (gastroesophageal reflux disease) 12/15/2016  . Dysphagia 12/05/2016  . Chronic pain of right heel 10/26/2016  . Eczema 10/16/2014  . 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 corrective foot surgery Bilateral   .  wisdom teeth      OB History   No obstetric history on file.      Home Medications    Prior to Admission medications   Medication Sig Start Date End Date Taking? Authorizing Provider  clotrimazole-betamethasone (LOTRISONE) cream Apply to affected area 2 times daily prn 01/24/19  Yes Teshara Moree V, PA-C  mupirocin ointment (BACTROBAN) 2 % Apply 1 application topically 2 (two) times daily. 01/24/19  Yes Bernece Gall V, PA-C  PARoxetine (PAXIL) 20 MG tablet Take 1 tablet (20 mg total) by mouth daily. 06/11/18  Yes Garth Bignessimberlake, Kathryn, MD  guaiFENesin 200 MG tablet Take 1 tablet (200 mg total) by mouth every 8 (eight) hours as needed for cough or to loosen phlegm. 08/23/17   Marquette SaaLancaster, Abigail Joseph, MD  hydrocortisone 1 % ointment Apply 1 application topically 2 (two) times daily. 01/18/17   Beaulah DinningGambino, Christina M, MD  methocarbamol (ROBAXIN) 500 MG tablet Take 1 tablet (500 mg total) by mouth 2 (two) times daily. 02/04/19   Cathie HoopsYu, Marquie Aderhold V, PA-C  nitroGLYCERIN (NITRODUR - DOSED IN MG/24 HR) 0.2 mg/hr patch Place 1 patch (0.2 mg total) onto the skin daily. 02/07/17   Wendee BeaversMcMullen, David J, DO  predniSONE (DELTASONE) 50 MG tablet Take 1 tablet (50 mg total) by mouth daily with breakfast. 02/04/19   Belinda FisherYu, Shaylea Ucci V, PA-C    Family History Family History  Problem Relation Age of  Onset  . Early death Mother   . Heart disease Mother   . Kidney disease Mother   . Cancer Father        pancreatic  . Pancreatic cancer Father   . Crohn's disease Sister   . Stomach cancer Maternal Aunt   . Colon cancer Neg Hx   . Colon polyps Neg Hx   . Esophageal cancer Neg Hx   . Rectal cancer Neg Hx     Social History Social History   Tobacco Use  . Smoking status: Former Smoker    Packs/day: 0.25    Types: Cigarettes, Cigars    Last attempt to quit: 09/12/2011    Years since quitting: 7.4  . Smokeless tobacco: Never Used  Substance Use Topics  . Alcohol use: Yes    Alcohol/week: 14.0 standard drinks    Types: 14 Cans of beer  per week    Comment: Binges on Weekend. Endorses sometimes needing "eye opener."  . Drug use: No     Allergies   Patient has no known allergies.   Review of Systems Review of Systems  Reason unable to perform ROS: See HPI as above.     Physical Exam Triage Vital Signs ED Triage Vitals  Enc Vitals Group     BP 02/04/19 1133 102/73     Pulse Rate 02/04/19 1133 (!) 105     Resp 02/04/19 1133 18     Temp 02/04/19 1133 98.4 F (36.9 C)     Temp Source 02/04/19 1133 Oral     SpO2 02/04/19 1133 98 %     Weight --      Height --      Head Circumference --      Peak Flow --      Pain Score 02/04/19 1134 10     Pain Loc --      Pain Edu? --      Excl. in GC? --    No data found.  Updated Vital Signs BP 102/73 (BP Location: Left Arm)   Pulse (!) 105   Temp 98.4 F (36.9 C) (Oral)   Resp 18   LMP 12/17/2018   SpO2 98%   Physical Exam Constitutional:      Appearance: She is well-developed.     Comments: Patient tearful, in pain, leaning to the right on wheel chair. No acute distress.  HENT:     Head: Normocephalic and atraumatic.  Eyes:     Conjunctiva/sclera: Conjunctivae normal.     Pupils: Pupils are equal, round, and reactive to light.  Neck:     Musculoskeletal: Normal range of motion and neck supple.  Musculoskeletal:     Comments: Exam limited given patient in wheelchair and painful movement. No obvious tenderness to spinous processes. Tenderness to palpation diffusely of left lower back, buttock. No tenderness to palpation of left anterior hip.  ROM limited due to pain and positioning. Patient able to internal and external rotate hips, though complaining of pain to the thigh during movement. Denies hip pain with movement. Sensation intact. Unable to perform straight leg raise. Unable to perform ROM of back.  Neurological:     Mental Status: She is alert and oriented to person, place, and time.     UC Treatments / Results  Labs (all labs ordered are  listed, but only abnormal results are displayed) Labs Reviewed - No data to display  EKG None  Radiology Dg Pelvis 1-2 Views  Result Date: 02/04/2019 CLINICAL DATA:  Status post fall. EXAM: PELVIS - 1-2 VIEW COMPARISON:  None FINDINGS: There is no evidence of pelvic fracture or diastasis. No pelvic bone lesions are seen. IMPRESSION: Negative. Electronically Signed   By: Signa Kell M.D.   On: 02/04/2019 12:23    Procedures Procedures (including critical care time)  Medications Ordered in UC Medications  ketorolac (TORADOL) 30 MG/ML injection 30 mg (30 mg Intramuscular Given 02/04/19 1148)    Initial Impression / Assessment and Plan / UC Course  I have reviewed the triage vital signs and the nursing notes.  Pertinent labs & imaging results that were available during my care of the patient were reviewed by me and considered in my medical decision making (see chart for details).    Due to pain will provide toradol injection in office. Lower suspicion for hip fracture given patient able to internal and external rotate, and has been able to stand up as well. However, will obtain pelvis xray for further evaluation.  Patient expresses significant relief with toradol injection. Now moving back/hips liberally without signs of pain. Will provide prednisone and robaxin as needed. Follow up with PCP for further evaluation if symptoms not improving. Return precautions given.   Final Clinical Impressions(s) / UC Diagnoses   Final diagnoses:  Acute left-sided low back pain, unspecified whether sciatica present  Left buttock pain  Fall, initial encounter    ED Prescriptions    Medication Sig Dispense Auth. Provider   predniSONE (DELTASONE) 50 MG tablet Take 1 tablet (50 mg total) by mouth daily with breakfast. 5 tablet Maurice Fotheringham V, PA-C   methocarbamol (ROBAXIN) 500 MG tablet Take 1 tablet (500 mg total) by mouth 2 (two) times daily. 20 tablet Threasa Alpha, [place]  02/04/19 1511

## 2019-02-24 ENCOUNTER — Telehealth (INDEPENDENT_AMBULATORY_CARE_PROVIDER_SITE_OTHER): Payer: BC Managed Care – PPO | Admitting: Student in an Organized Health Care Education/Training Program

## 2019-02-24 ENCOUNTER — Other Ambulatory Visit: Payer: Self-pay

## 2019-02-24 DIAGNOSIS — Z20828 Contact with and (suspected) exposure to other viral communicable diseases: Secondary | ICD-10-CM

## 2019-02-24 DIAGNOSIS — Z20822 Contact with and (suspected) exposure to covid-19: Secondary | ICD-10-CM | POA: Insufficient documentation

## 2019-02-24 NOTE — Assessment & Plan Note (Signed)
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 - Referral for COVID-19 drive-up testing - strict call-back and ED precautions for respiratory symptoms were provided - patient advised to self-isolate - note for work will be printed and mailed. She may call back with a fax number for this to be sent to.

## 2019-02-24 NOTE — Progress Notes (Signed)
[place] Telemedicine Visit  Patient consented to have virtual visit. Method of visit: Video was attempted, but technology challenges prevented patient from using video, so visit was conducted via telephone.    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 She reports that her nephew and his mother have been intermittently homeless and staying at her home, and her nephew is sick and tested positive for COVID-19. She reports that she was asymptomatic up until this morning when she developed cough and rhinorrhea. No fever. No dyspnea. No muscle aches. No sore throat.  COVID Drive-Up Test Referral Criteria  Patient age: 33 y.o.  Symptoms: Rhinorrhea and cough  Underlying Conditions: No underlying conditions  Is the patient a first responder? No  Patient works in a nursing home  Does the patient live or work in a high risk or high density environment: Long-term care facility (including group homes)  Is the patient a [place] convalescent patient who is 14-28 days symptom-free and interested in donating plasma for use as a therapeutic product? 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 - Referral for COVID-19 drive-up testing - strict call-back and ED precautions for respiratory symptoms were provided - patient advised to self-isolate - note for work will be printed and mailed. She may call back with a  fax number for this to be sent to.    Time spent during visit with patient: 10 minutes

## 2019-02-25 ENCOUNTER — Telehealth: Payer: Self-pay | Admitting: *Deleted

## 2019-02-25 ENCOUNTER — Other Ambulatory Visit: Payer: BC Managed Care – PPO

## 2019-02-25 DIAGNOSIS — Z20822 Contact with and (suspected) exposure to covid-19: Secondary | ICD-10-CM

## 2019-02-25 NOTE — Telephone Encounter (Signed)
Testing set up today at [facility] site for 9:45am. Informed to wear mask and stay in vehicle.

## 2019-02-27 ENCOUNTER — Telehealth: Payer: Self-pay

## 2019-02-27 LAB — NOVEL CORONAVIRUS, NAA: SARS-CoV-2, NAA: NOT DETECTED

## 2019-02-27 NOTE — Telephone Encounter (Signed)
Patient call nurse line asking if her letter has been mailed yet. I did not see one in the chart. Please advise. This can mailed per patient request. Address correct in chart.

## 2019-02-28 NOTE — Telephone Encounter (Signed)
I wrote a note to send through the mail. I believe she wanted [place] to fax a copy of the letter. She can obtain the letter through [facility], and if she provides a fax # we can fax it to her job.  Thanks, Ander Purpura

## 2019-02-28 NOTE — Telephone Encounter (Signed)
Pt called to check status.  Printed 2 copies.  mailed one to home  Fax other to: facilities management @ [place], CMA

## 2019-03-03 ENCOUNTER — Telehealth: Payer: Self-pay | Admitting: Family Medicine

## 2019-03-03 NOTE — Telephone Encounter (Signed)
Patient informed of COVID results. Expressed understanding.

## 2019-03-04 NOTE — Telephone Encounter (Signed)
Patient calls nurse line stating she needs a letter to return back to work. Patient stated the letter needs to reflect June 29th return date. This can be mailed to her. Please advise.

## 2019-03-05 ENCOUNTER — Encounter: Payer: Self-pay | Admitting: Family Medicine

## 2019-03-05 NOTE — Telephone Encounter (Signed)
New letter updated. Please advise.  Harriet [place], [place], PGY-3

## 2019-03-24 ENCOUNTER — Other Ambulatory Visit: Payer: Self-pay

## 2019-03-24 ENCOUNTER — Encounter: Payer: Self-pay | Admitting: Family Medicine

## 2019-03-24 ENCOUNTER — Telehealth: Payer: BC Managed Care – PPO | Admitting: Family Medicine

## 2019-03-24 DIAGNOSIS — R21 Rash and other nonspecific skin eruption: Secondary | ICD-10-CM | POA: Insufficient documentation

## 2019-03-24 DIAGNOSIS — L282 Other prurigo: Secondary | ICD-10-CM | POA: Insufficient documentation

## 2019-03-24 NOTE — Progress Notes (Signed)
   Telemedicine Visit Patient consented to have visit conducted via telephone.  Encounter participants: Patient: Jane Marshall  Provider: Wilber Oliphant, MD  CC: Rash HPI [facility] is a 33 y.o. female with pmhx s/f psoriasis, eczema, dihydrotic eczema, urticarial rash. She reports that on the day after memorial day, went to urgent care for a rash on the side of her ankle which was very itchy and relieved with cream prescribed by provider.  Later on in the month, there was itching on the bottom of both feet in the center and on the sides. Has never had this issue before. She reports red lesions was arise and go away in two weeks. Extremely pruritic. There are lesions of different sizes located on mid sole. She denies any pustular lesions. She also has some pruritic papules on the sides of her fingers. There are now papules on other parts of her hand.   ROS: See HPI  Social Hx: Ailsa reports that she quit smoking about 7 years ago. Her smoking use included cigarettes and cigars. She smoked 0.25 packs per day. She has never used smokeless tobacco. She reports current alcohol use of about 14.0 standard drinks of alcohol per week. She reports that she does not use drugs.   Observations:  Dark red lesions of multiple sizes on mid sole. Picture quality is poor.   A/P  There is a large differential for patient's rash of bilateral soles of feet ranging from Coxsackie, syphillis, psoriasis. Due to this, would be better to see patient in clinic to visualize rash better and perform necessary labs.   Time spent on phone with patient: 20 minutes    Zettie Cooley, M.D. [place]  PGY -1 03/24/2019, 3:48 PM

## 2019-03-25 ENCOUNTER — Ambulatory Visit: Payer: BC Managed Care – PPO | Admitting: Family Medicine

## 2019-03-26 ENCOUNTER — Encounter: Payer: Self-pay | Admitting: Family Medicine

## 2019-03-26 ENCOUNTER — Other Ambulatory Visit: Payer: Self-pay

## 2019-03-26 ENCOUNTER — Ambulatory Visit (INDEPENDENT_AMBULATORY_CARE_PROVIDER_SITE_OTHER): Payer: BC Managed Care – PPO | Admitting: Family Medicine

## 2019-03-26 VITALS — BP 106/70 | HR 86 | Ht 67.0 in | Wt 224.5 lb

## 2019-03-26 DIAGNOSIS — L301 Dyshidrosis [pompholyx]: Secondary | ICD-10-CM | POA: Diagnosis not present

## 2019-03-26 LAB — POCT SKIN KOH: Skin KOH, POC: NEGATIVE

## 2019-03-26 MED ORDER — BETAMETHASONE VALERATE 0.1 % EX OINT: 1.0000 "application " | TOPICAL_OINTMENT | Freq: Two times a day (BID) | CUTANEOUS | 0 refills | Status: DC

## 2019-03-26 NOTE — Progress Notes (Signed)
    Subjective:  Jane Marshall is a 33 y.o. female who presents to the [facility] today with a chief complaint of pruritic rash on palms and soles of feet.   HPI:  Patient with 2 to 5-month history of pruritic rash on hands that then migrated to her feet.  Is extremely pruritic.  Patient was seen in urgent care where she was treated with combined clotrimazole/betamethasone/mupirocin.  Patient noticed some improvement with treatment, but had persistent lesions.  Patient notices that the lesions come and go in different areas.  Visit they will be blisterlike and dry out.  It is worse in hot weather when her feet are sweaty.  Patient denies fevers or chills.  Says that the itch worsens with her menstrual cycle.   ROS: Per HPI   Objective:  Physical Exam: BP 106/70   Pulse 86   Ht 5\' 7"  (1.702 m)   Wt 224 lb 8 oz (101.8 kg)   LMP 03/26/2019   SpO2 98%   BMI 35.16 kg/m   Gen: NAD, resting comfortably MSK: no edema, cyanosis, or clubbing noted Skin: Scaling, not erythematous, multiple distinct macules and patches, some darker than base skin tone Neuro: grossly normal, moves all extremities Psych: Normal affect and thought content         Results for orders placed or performed in visit on 03/26/19 (from the past 72 hour(s))  POCT Skin KOH     Status: None   Collection Time: 03/26/19  2:30 PM  Result Value Ref Range   Skin KOH, POC Negative Negative     Assessment/Plan:  Vesicular palmoplantar eczema Patient with history of dyshidrotic eczema presenting with worsening case particularly on the plantar surface of her feet.  Previously treated with betamethasone with some improvement.  Recommend that we continue this as medium/high potency steroid.  Recommend discontinuation of clotrimazole/mupirocin.  Consider higher potency steroid if patient has no improvement.  Patient to return PRN.    Lab Orders     POCT Skin KOH  Meds ordered this encounter  Medications  . betamethasone  valerate ointment (VALISONE) 0.1 %    Sig: Apply 1 application topically 2 (two) times daily.    Dispense:  30 g    Refill:  0      Marny Lowenstein, MD, MS FAMILY MEDICINE RESIDENT - PGY2 03/26/2019 3:24 PM

## 2019-03-26 NOTE — Assessment & Plan Note (Signed)
Patient with history of dyshidrotic eczema presenting with worsening case particularly on the plantar surface of her feet.  Previously treated with betamethasone with some improvement.  Recommend that we continue this as medium/high potency steroid.  Recommend discontinuation of clotrimazole/mupirocin.  Consider higher potency steroid if patient has no improvement.  Patient to return PRN.

## 2019-03-26 NOTE — Patient Instructions (Signed)
It was a pleasure to see you today! Thank you for choosing [facility] for your primary care. Jane Marshall was seen for itching foot rash.  1.  You likely have what is known is dyshidrotic eczema.  This also goes by other names such as palmaoplantar eczema.  We are can treat you with a medium to high potency steroid follow-up betamethasone.  Apply this 1 time twice a day.  Also keep your feet dry with foot powders as needed.   Come back to the clinic if no improvement in several days.  Best,  Marny Lowenstein, MD, MS FAMILY MEDICINE RESIDENT - PGY2 03/26/2019 2:33 PM

## 2019-04-23 ENCOUNTER — Other Ambulatory Visit: Payer: Self-pay

## 2019-04-23 ENCOUNTER — Ambulatory Visit: Payer: BC Managed Care – PPO | Admitting: Family Medicine

## 2019-04-23 VITALS — BP 110/70 | HR 60 | Temp 98.3°F | Wt 218.0 lb

## 2019-04-23 DIAGNOSIS — Z113 Encounter for screening for infections with a predominantly sexual mode of transmission: Secondary | ICD-10-CM

## 2019-04-23 DIAGNOSIS — L301 Dyshidrosis [pompholyx]: Secondary | ICD-10-CM

## 2019-04-23 MED ORDER — CLOBETASOL PROPIONATE 0.05 % EX OINT: 1.0000 "application " | TOPICAL_OINTMENT | Freq: Two times a day (BID) | CUTANEOUS | 1 refills | Status: DC

## 2019-04-23 NOTE — Progress Notes (Signed)
  Subjective:   Patient ID: Jane Marshall    DOB: [birth year], 33 y.o. female   MRN: [id number]  [facility] Jane Marshall is a 33 y.o. female with a history of GERD, eczema here for   Rash on feet - Reports noticing pruritic bumps on backs of hands in February followed by involvement of soles of feet in May. Reports hand involvement now comes and goes but the rahs on her feet have stayed persistent despite treatment. - She was seen at urgent care where she received clotrimazole/betamethasone/mupirocin with some relief in itching. Seen again 7/15 for same, switched from clotrimazole/betamethasone/mupirocin to medium/high potency steroid. KOH at that time negative. - rash starts out as clear fluid filled blister then pops and has darkened color to skin. - reports itching has now returned and is sometimes burning. Is worse at night.  - Is on soles of both feet, R>L.  - no one else at home has similar rash. - Denies discharge, fevers, mouth sores, face or tongue swelling, difficulties breathing, joint swelling or pain. - denies new cream, detergent, soaps. Did get a new dog in December but denies tick or flea exposure. - She is sexually active with females, does not wear condoms.   Review of Systems:  Per HPI.  [place], medications and smoking status reviewed.  Objective:   BP 110/70   Pulse 60   Temp 98.3 F (36.8 C) (Oral)   Wt 218 lb (98.9 kg)   LMP 03/26/2019   SpO2 90%   BMI 34.14 kg/m  Vitals and nursing note reviewed.  General: Overweight female, in no acute distress with non-toxic appearance Skin: warm, dry.  Scattered hyperpigmented lesions to soles of bilateral feet, R>L with some overlying scale.  No vesicles or pustules. Extremities: warm and well perfused, normal tone MSK: ROM grossly intact, strength intact, gait normal Neuro: Alert and oriented, speech normal        Assessment & Plan:   Dyshidrotic eczema Symptoms and exam consistent with dyshidrotic eczema as previously  noted in prior visit.  KOH at that time negative.  Will increase potency of steroid to high potency clobetasol.  Given sexual activity without protection against STI, will also order RPR to rule out secondary syphilis.  Also considered but very low likelihood of infection, tickborne illness, drug reaction.  Return precautions discussed, see AVS.    Orders Placed This Encounter  Procedures  . RPR   Meds ordered this encounter  Medications  . clobetasol ointment (TEMOVATE) 0.05 %    Sig: Apply 1 application topically 2 (two) times daily. For very severe eczema.  Do not use for more than 1 week at a time.    Dispense:  60 g    Refill:  [place], DO PGY-3, [facility] 04/23/2019 6:02 PM

## 2019-04-23 NOTE — Assessment & Plan Note (Addendum)
Symptoms and exam consistent with dyshidrotic eczema as previously noted in prior visit.  KOH at that time negative.  Will increase potency of steroid to high potency clobetasol.  Given sexual activity without protection against STI, will also order RPR to rule out secondary syphilis.  Also considered but very low likelihood of infection, tickborne illness, drug reaction.  Return precautions discussed, see AVS.

## 2019-04-23 NOTE — Patient Instructions (Signed)
It was great to see you!  Our plans for today:  - Use the new steroid ointment. This is a higher potency steroid. If this does not help within a few weeks, come back to see [place]. - If the rash changes, becomes red, hot, swollen, or has white or green discharge, or if you develop fever, come back to be seen.  We are checking some labs today, we will call you or send you a letter if they are abnormal.   Take care and seek immediate care sooner if you develop any concerns.   Dr. [facility]

## 2019-04-24 LAB — RPR: RPR Ser Ql: NONREACTIVE

## 2019-04-25 ENCOUNTER — Encounter: Payer: Self-pay | Admitting: Family Medicine

## 2019-04-28 MED ORDER — PAROXETINE HCL 20 MG PO TABS: 20.0000 mg | ORAL_TABLET | Freq: Every day | ORAL | 3 refills | Status: DC

## 2019-04-30 ENCOUNTER — Ambulatory Visit: Payer: BC Managed Care – PPO | Admitting: Family Medicine

## 2019-04-30 ENCOUNTER — Encounter: Payer: Self-pay | Admitting: Family Medicine

## 2019-04-30 ENCOUNTER — Other Ambulatory Visit (HOSPITAL_COMMUNITY)
Admission: RE | Admit: 2019-04-30 | Discharge: 2019-04-30 | Disposition: A | Discharge status: Home / Self Care | Payer: BC Managed Care – PPO | Source: Ambulatory Visit | Attending: Family Medicine | Admitting: Family Medicine

## 2019-04-30 ENCOUNTER — Other Ambulatory Visit: Payer: Self-pay

## 2019-04-30 VITALS — BP 110/72 | HR 69

## 2019-04-30 DIAGNOSIS — L729 Follicular cyst of the skin and subcutaneous tissue, unspecified: Secondary | ICD-10-CM | POA: Diagnosis not present

## 2019-04-30 DIAGNOSIS — Z113 Encounter for screening for infections with a predominantly sexual mode of transmission: Secondary | ICD-10-CM

## 2019-04-30 DIAGNOSIS — Z3009 Encounter for other general counseling and advice on contraception: Secondary | ICD-10-CM

## 2019-04-30 DIAGNOSIS — Z124 Encounter for screening for malignant neoplasm of cervix: Secondary | ICD-10-CM | POA: Diagnosis present

## 2019-04-30 MED ORDER — MUPIROCIN 2 % EX OINT: 1.0000 "application " | TOPICAL_OINTMENT | Freq: Two times a day (BID) | CUTANEOUS | 0 refills | Status: DC

## 2019-04-30 NOTE — Patient Instructions (Signed)
Thanks so much for coming in to see me! Today we did your papsmear. I will call you if any abnormal results.   Please call if you have any questions or concerns, otherwise I recommend coming in annually to be seen.  Take care, Dr. Tarry Kos

## 2019-04-30 NOTE — Progress Notes (Signed)
Subjective:   Patient ID: Jane Marshall    DOB: 04-10-1986, 33 y.o. female   MRN: [id number]  [name] is a 33 y.o. female with a history of GERD, eczema, high risk sexual behavior here for pap-smear  No concerns today.  CC: pap-smear Patient here today for papsmear. Denies any vaginal discharge, abnormal bleeding, pelvic pain, genital lesions/sores, itching. Sexually active with 1 female sexual partner. Not currently on birth control. Patient is not interested in birth control. She has been on the pill before. Patient would like STD testing as well today.  Cystic lesion on left breast: Patient endorses periodic cystic lesions that develop on breast and below breasts. These occur primarily right before her period. They improve after her period. They are particularly tender during this time. Notes she treats with intermittent OTC neosporin.   Patient denies tobacco use. Drinks daily, endorses more recently due to increased stress. Denies any other illicit drug. Occasional marijuana use. Denies vaping.   Review of Systems:  Per HPI.   PMFSH, medications and smoking status reviewed.  Objective:   BP 110/72    Pulse 69    SpO2 98%  Vitals and nursing note reviewed.  General: well nourished, well developed, in no acute distress with non-toxic appearance CV: regular rate and rhythm without murmurs, rubs, or gallops, no lower extremity edema Lungs: clear to auscultation bilaterally with normal work of breathing Abdomen: soft, non-tender, non-distended, normoactive bowel sounds Skin: cystic lesion with visible head on left medial breast, tender to palpation, no warmth or surrounding erythema Extremities: warm and well perfused MSK: gait normal  Neuro: Alert and oriented, speech normal  Pelvic exam: VULVA: normal appearing vulva with no masses, tenderness or lesions, VAGINA: normal appearing vagina with normal color and discharge, no lesions, CERVIX: normal appearing cervix without  discharge or lesions, UTERUS: uterus is normal size, shape, consistency and nontender, ADNEXA: normal adnexa in size, nontender and no masses.  Assessment & Plan:   Benign cyst of skin Acute on chronic, occurring on breasts bilaterally and within inframammary fold. Appears most consistent with epidermoid cyst or sebaceous cyst. Hidradenitis suppurativa of the breast area is possible but less common and no other cystic lesions appreciated on exam including in the axilla or pubic area. Appears to be hormonally affected with monthly menstrual cycle. No signs of infection today. Patient is obese with report of abnormal periods - PCOS is possible and may be contributing to development of these cysts. Currently treating with OTC Neosporin - recommend warm compresses, Mupirocin ointment PRN - continue to monitor - RTC if becomes red, hot, inflamed, very tender to touch - consider work up for PCOS vs birth control given appears to be hormonally induced  Birth control counseling Reviewed birth control options. Patient is not interested at this time. STD screen performed.  - Patient to RTC if desires birth control  Screen for STD (sexually transmitted disease) No current symptoms. Per patient request, HIV, GC, Chlamydia, Tric ordered today. RPR ordered 1 week ago and negative. - Will contact patient if treatment indicated  Health Maintenance: Pap-smear (cytology with HPV) performed today.  Discussed alcohol use, particularly for stress relief. Recommend walking to help relieve stress. Patient agreeable to plan.  Orders Placed This Encounter  Procedures   HIV antibody (with reflex)   Meds ordered this encounter  Medications   mupirocin ointment (BACTROBAN) 2 %    Sig: Apply 1 application topically 2 (two) times daily.    Dispense:  22 g    Refill:  0    Mina Marble, DO PGY-2, [facility] 04/30/2019 8:33 PM

## 2019-04-30 NOTE — Assessment & Plan Note (Addendum)
Reviewed birth control options. Patient is not interested at this time. STD screen performed.  - Patient to RTC if desires birth control

## 2019-04-30 NOTE — Assessment & Plan Note (Addendum)
Acute on chronic, occurring on breasts bilaterally and within inframammary fold. Appears most consistent with epidermoid cyst or sebaceous cyst. Hidradenitis suppurativa of the breast area is possible but less common and no other cystic lesions appreciated on exam including in the axilla or pubic area. Appears to be hormonally affected with monthly menstrual cycle. No signs of infection today. Patient is obese with report of abnormal periods - PCOS is possible and may be contributing to development of these cysts. Currently treating with OTC Neosporin - recommend warm compresses, Mupirocin ointment PRN - continue to monitor - RTC if becomes red, hot, inflamed, very tender to touch - consider work up for PCOS vs birth control given appears to be hormonally induced

## 2019-04-30 NOTE — Assessment & Plan Note (Addendum)
No current symptoms. Per patient request, HIV, GC, Chlamydia, Tric ordered today. RPR ordered 1 week ago and negative. - Will contact patient if treatment indicated

## 2019-05-01 ENCOUNTER — Encounter: Payer: Self-pay | Admitting: Family Medicine

## 2019-05-01 LAB — CYTOLOGY - PAP
Diagnosis: NEGATIVE
HPV: NOT DETECTED

## 2019-05-01 LAB — CERVICOVAGINAL ANCILLARY ONLY
Chlamydia: NEGATIVE
Neisseria Gonorrhea: NEGATIVE
Trichomonas: NEGATIVE

## 2019-05-01 LAB — HIV ANTIBODY (ROUTINE TESTING W REFLEX): HIV Screen 4th Generation wRfx: NONREACTIVE

## 2019-05-02 ENCOUNTER — Telehealth: Payer: Self-pay

## 2019-05-02 NOTE — Telephone Encounter (Signed)
Informed patient of negative results.  .Michelle R Simpson, CMA  

## 2019-05-23 ENCOUNTER — Ambulatory Visit: Payer: BC Managed Care – PPO | Admitting: Family Medicine

## 2019-05-23 ENCOUNTER — Other Ambulatory Visit: Payer: Self-pay

## 2019-05-23 VITALS — BP 110/80 | HR 100 | Wt 228.6 lb

## 2019-05-23 DIAGNOSIS — L301 Dyshidrosis [pompholyx]: Secondary | ICD-10-CM | POA: Diagnosis not present

## 2019-05-23 DIAGNOSIS — L732 Hidradenitis suppurativa: Secondary | ICD-10-CM | POA: Diagnosis not present

## 2019-05-23 DIAGNOSIS — R21 Rash and other nonspecific skin eruption: Secondary | ICD-10-CM

## 2019-05-23 LAB — POCT SKIN KOH: Skin KOH, POC: NEGATIVE

## 2019-05-23 MED ORDER — DOXYCYCLINE HYCLATE 100 MG PO TABS: 100.0000 mg | ORAL_TABLET | Freq: Two times a day (BID) | ORAL | 0 refills | Status: DC

## 2019-05-23 MED ORDER — FLUCONAZOLE 200 MG PO TABS: 200.0000 mg | ORAL_TABLET | ORAL | 0 refills | Status: DC

## 2019-05-23 NOTE — Patient Instructions (Addendum)
It was great meeting you today!  I was able to express some pus out of your buttock boil.  You can apply a warm towel to this area which should help it drain on its own.  Given that you had several of these areas I do not think doing an incision and drainage is a good idea as it can increase your risk for sinus tract formation.  I will give you an antibiotic called doxycycline.  Take it 2 times per day for 7 days.  In regards to your foot rash we performed a KOH scrape again which was negative.  Due to the callus-like structure of these rashes I do not think a normal skin scraping can actually get down deep enough to be positive.  I am going to treat like a fungal rash.  The treatment is fluconazole 200 mg weekly for 4 weeks.

## 2019-05-26 ENCOUNTER — Ambulatory Visit: Payer: BC Managed Care – PPO | Admitting: Family Medicine

## 2019-05-28 ENCOUNTER — Encounter: Payer: Self-pay | Admitting: Family Medicine

## 2019-05-28 DIAGNOSIS — L732 Hidradenitis suppurativa: Secondary | ICD-10-CM | POA: Insufficient documentation

## 2019-05-28 NOTE — Assessment & Plan Note (Signed)
Appearance of foot rash more in line with fungal etiology.  Various KOH cravings likely negative secondary to large calluses overlying the region areas.  Likely to unroofed this calluses to actually get a positive KOH result.  Similarly topical antifungal probably will not reach these areas.  Will treat with Diflucan 200 mg weekly for 4 weeks.

## 2019-05-28 NOTE — Assessment & Plan Note (Signed)
Able to express purulent fluid from furuncle located in patient's intergluteal cleft.  Given the patient's overall areas of hyperpigmentation this likely represent hydradenitis. Can apply warm compresses to the area to help with expression. Will opt to treat with doxycycline. Gave counseling on keep patient clean and dry to help prevent future occurrence. - doxycycline 100mg  bid for 7 days - gave counseling on how to limit future furuncles

## 2019-05-28 NOTE — Progress Notes (Signed)
   HPI 33 year old female who presents with bilateral foot rash.  She states the rash is been present for several months has slowly gotten worse.  She was last seen on 03/26/2019 for similar complaint.  A KOH prep was performed at that time and was negative.  She was treated with betamethasone which she states helped a little bit but, the rash returned to its usual appearance after she stopped it.  The patient also has a "boil" slightly inferior to her sacral decubitus area, within her intergluteal cleft.  She states that she has had several boils in his area and that they usually drain on their own.  This boil started as a painful area a few days ago and only became a bump the day prior to the clinic visit.  KOH prep performed was negative.  CC: Bilateral foot rash   ROS:   Review of Systems See HPI for ROS.   CC, SH/smoking status, and VS noted  Objective: BP 110/80   Pulse 100   Wt 228 lb 9.6 oz (103.7 kg)   SpO2 100%   BMI 35.80 kg/m  Gen: Well-appearing 33 year old African-American female, no acute distress, very pleasant CV: Regular rate and rhythm, no M/G.  Palpable PT/DP bilaterally. Resp: Lungs clear to auscultation bilaterally, no accessory muscle use Neuro: Alert and oriented, Speech clear, No gross deficits Buttock area: Multiple areas of hyperpigmented skin, in various stages of healing consistent with ruptured furuncles.  Large unruptured furuncle noted roughly 2 cm in diameter, able to express mild amount of pus via expression. Feet: Thick callus-like skin over hyperpigmented approximately 1cm next week.,  satellite lesions noted.            Assessment and plan:  Dyshidrotic eczema Appearance of foot rash more in line with fungal etiology.  Various KOH cravings likely negative secondary to large calluses overlying the region areas.  Likely to unroofed this calluses to actually get a positive KOH result.  Similarly topical antifungal probably will not reach  these areas.  Will treat with Diflucan 200 mg weekly for 4 weeks.  Hydradenitis Able to express purulent fluid from furuncle located in patient's intergluteal cleft.  Given the patient's overall areas of hyperpigmentation this likely represent hydradenitis. Can apply warm compresses to the area to help with expression. Will opt to treat with doxycycline. Gave counseling on keep patient clean and dry to help prevent future occurrence. - doxycycline 100mg  bid for 7 days - gave counseling on how to limit future furuncles   Orders Placed This Encounter  Procedures  . POCT Skin KOH    Meds ordered this encounter  Medications  . DISCONTD: doxycycline (VIBRA-TABS) 100 MG tablet    Sig: Take 1 tablet (100 mg total) by mouth 2 (two) times daily.    Dispense:  14 tablet    Refill:  0  . fluconazole (DIFLUCAN) 200 MG tablet    Sig: Take 1 tablet (200 mg total) by mouth once a week.    Dispense:  4 tablet    Refill:  0  . doxycycline (VIBRA-TABS) 100 MG tablet    Sig: Take 1 tablet (100 mg total) by mouth 2 (two) times daily.    Dispense:  14 tablet    Refill:  0   Guadalupe Dawn MD PGY-3 Family Medicine Resident  05/28/2019 8:08 AM

## 2019-06-26 ENCOUNTER — Telehealth: Payer: Self-pay | Admitting: Licensed Clinical Social Worker

## 2019-06-26 NOTE — Telephone Encounter (Signed)
Care Coordination Phone Note Social Work   06/26/2019 Name: Jane Marshall MRN: [id number] DOB: 1986/02/25  Jane Marshall is a 34 y.o. year old female who sees Danna Hefty, DO for primary care. LCSW was contacted by patient for assistance with Mental Health Counseling and Resources. LCSW saw patient Jan. 2019. Assessment: Patient continues to experience difficulty with managing Adverse childhood experiences that negatively impact her life.  Recommendation: Patient may benefit from and is in agreement to receive further assessment and ongoing therapeutic interventions to assist with managing her symptoms. Goal: connect with Therapist Current Barriers:  . Lacks knowledge of community resource: for therapy Clinical Social Work Clinical Goal(s): 1. Over the next 20 days, patient will work with [employer] to address needs related to Trauma  2. Over the next 2 weeks LCSW will support patient until she starts therapy Interventions: . Patient interviewed and appropriate assessments performed . Provided patient with information about community therapist  . Assisted patient with connecting to the [employer] . Other interventions: . Solution-Focused Strategies,   . Emotional support  Patient Self Care Activities:  . Calls provider office for new concerns or questions Plan Summary:  1. Patient will complete all paper work e-mailed by [employer] 2. SW will follow up with patient by phone over the next 3 to 5 days  Dr. Tarry Kos has been informed of this outreach and plan.   Casimer Lanius, LCSW Clinical Social Worker [place] / [place]   [phone] 9:55 AM

## 2019-07-02 ENCOUNTER — Telehealth: Payer: Self-pay | Admitting: Licensed Clinical Social Worker

## 2019-07-02 NOTE — Telephone Encounter (Signed)
   Unsuccessful Phone Outreach Note  07/02/2019 Name: SILVANA HOLECEK MRN: [id number] DOB: 1986-06-09  Referred LX:[facility]IO Reason for referral : Care Coordination (for ongoing therapy needs)   [facility] Bucker is a 33 y.o. year old female who sees [place], Archie Endo, DO for primary care.   Called patient to assess needs and barriers with completing scheduled appointment Monday 06/30/2019 with [facility] Telephone outreach was unsuccessful. A HIPPA compliant phone message was left for the patient providing contact information to call if she encountered any barriers with therapy appointment.  Plan: Left message for patient to call LCSW if needed.   Casimer Lanius, LCSW Clinical Social Worker [place] / [place]   [phone] 11:44 AM

## 2019-07-03 NOTE — Telephone Encounter (Signed)
   Care Coordination Phone outreach Note Social Work   07/03/2019 Name: Jane Marshall MRN: [id number] DOB: 02/20/1986  LCSW received return call/voice message from patient.  Patient will attended first therapy appointment at [facility] and all went well.  She is feeling better and ready to deal with and put her past behind her.  Patient appreciative of assistance from LCSW.  Plan:  1. Patient will keep next appointment with therapist 2. No further follow up required: by LCSW, patient will call if needed   Casimer Lanius, [place] / [place]   [phone] 1:43 PM

## 2019-07-15 ENCOUNTER — Ambulatory Visit: Payer: BC Managed Care – PPO | Admitting: Family Medicine

## 2019-07-15 ENCOUNTER — Other Ambulatory Visit: Payer: Self-pay

## 2019-07-15 ENCOUNTER — Other Ambulatory Visit (HOSPITAL_COMMUNITY)
Admission: RE | Admit: 2019-07-15 | Discharge: 2019-07-15 | Disposition: A | Discharge status: Home / Self Care | Payer: BC Managed Care – PPO | Source: Ambulatory Visit | Attending: Family Medicine | Admitting: Family Medicine

## 2019-07-15 VITALS — BP 124/70 | HR 85

## 2019-07-15 DIAGNOSIS — N898 Other specified noninflammatory disorders of vagina: Secondary | ICD-10-CM

## 2019-07-15 LAB — POCT WET PREP (WET MOUNT)
Clue Cells Wet Prep Whiff POC: NEGATIVE
Trichomonas Wet Prep HPF POC: ABSENT

## 2019-07-15 LAB — POCT URINE PREGNANCY: Preg Test, Ur: NEGATIVE

## 2019-07-15 MED ORDER — HYDROCORTISONE 1 % EX CREA: 1.0000 "application " | TOPICAL_CREAM | Freq: Two times a day (BID) | CUTANEOUS | 0 refills | Status: DC

## 2019-07-15 MED ORDER — FLUCONAZOLE 150 MG PO TABS: 150.0000 mg | ORAL_TABLET | Freq: Once | ORAL | 0 refills | Status: AC

## 2019-07-15 NOTE — Progress Notes (Signed)
  Subjective  CC: vaginal irritation and rash on foot  ATF:TDDU Jane Marshall is a 33 y.o. female who presents today with the following problems:  Vaginal irritation and itching  Denies discharge, dysuria. Just irritation in the area. She is sexually active with one partner. Does not use contraception or barrier method. She would like to be tested for STD, RPR, HIV today. Pt does have a history of boils in her groin area and on her breasts that come and go with her menstrual cycles. She is not interested in starting any contraception.   Pertinent P/F/SHx: STD exposure  ROS: Pertinent ROS included in HPI. Objective  Physical Exam:  BP 124/70   Pulse 85   LMP 06/21/2019 (Exact Date)   SpO2 96%  General: well appearing African American female.  GU: External vulva and vagina nonerythematous, without any obvious lesions or rash. White, clumpy discharge appreciated with no odor. Normal ruggae of vaginal walls.  Cervix is non erythematous and non-friable.  There is no cervical motion tenderness, masses or gross abnormalities appreciated during bimanual exam.    Pertinent Labs:  Wet prep: many bacteria, rare WBC, negative whiff. No yeast no trich.  UPT negative   Assessment & Plan    Problem List Items Addressed This Visit      Other   Vaginal irritation - Primary    Patient with signs of vaginitis on wet prep but no cause. She complains of itching, but no odor, no dysuria. Discharge is white and clumpy without odor. Itching and discharge are most consistent with yeast infection. Sensitivity and specificity of wet prep for yeast is 61 and 77% respectively. Given symptoms, will treat for yeast empirically. UPT negative. Diflucan to pharm with cream for symptomatic relief.       Relevant Orders   POCT Wet Prep [facility]) (Completed)   HIV Antibody (routine testing w rflx) (Completed)   RPR (Completed)   Cervicovaginal ancillary only   POCT urine pregnancy (Completed)     Wilber Oliphant, M.D.   PGY-2  Family Medicine  [phone] 07/16/2019 9:28 AM

## 2019-07-15 NOTE — Patient Instructions (Addendum)
Dear Jane Marshall,   It was good to see you! Thank you for taking your time to come in to be seen. Today, we discussed the following:   vaginal irritation and rash on foot   Your symptoms are consistent with a yeast infection; however, no yeast was found on the sample from today. We will treat you for this "empirically", meaning we will try medication based on your symptoms.   I sent you a cream to help with vaginal itching as well as fluconazole to treat for yeast infection.   If you continue to have symptoms after 3 days, please call for further follow up.   If your labs are normal, I will send you a message on MyChart. For any concerning labs, I will call you to discuss further evaluation and management.   Be well,   Zettie Cooley, M.D   [facility] [place] [phone]  *Sign up for MyChart for instant access to your health profile, labs, orders, upcoming appointments or to contact your provider with questions*  ===================================================================================

## 2019-07-16 ENCOUNTER — Encounter: Payer: Self-pay | Admitting: Family Medicine

## 2019-07-16 DIAGNOSIS — N898 Other specified noninflammatory disorders of vagina: Secondary | ICD-10-CM | POA: Insufficient documentation

## 2019-07-16 LAB — CERVICOVAGINAL ANCILLARY ONLY
Chlamydia: NEGATIVE
Comment: NEGATIVE
Comment: NORMAL
Neisseria Gonorrhea: NEGATIVE

## 2019-07-16 LAB — RPR: RPR Ser Ql: NONREACTIVE

## 2019-07-16 LAB — HIV ANTIBODY (ROUTINE TESTING W REFLEX): HIV Screen 4th Generation wRfx: NONREACTIVE

## 2019-07-16 NOTE — Assessment & Plan Note (Signed)
Patient with signs of vaginitis on wet prep but no cause. She complains of itching, but no odor, no dysuria. Discharge is white and clumpy without odor. Itching and discharge are most consistent with yeast infection. Sensitivity and specificity of wet prep for yeast is 61 and 77% respectively. Given symptoms, will treat for yeast empirically. UPT negative. Diflucan to pharm with cream for symptomatic relief.

## 2019-07-18 ENCOUNTER — Telehealth: Payer: Self-pay | Admitting: Family Medicine

## 2019-07-18 NOTE — Telephone Encounter (Signed)
Called patient to update results from last visit. Everything was normal. LVM.   Wilber Oliphant, M.D.  11:51 AM 07/18/2019

## 2019-08-28 ENCOUNTER — Other Ambulatory Visit: Payer: Self-pay

## 2019-08-28 ENCOUNTER — Telehealth: Payer: Self-pay | Admitting: Family Medicine

## 2019-08-28 ENCOUNTER — Ambulatory Visit: Payer: BC Managed Care – PPO | Admitting: Family Medicine

## 2019-08-28 VITALS — BP 110/62 | HR 61 | Wt 223.8 lb

## 2019-08-28 DIAGNOSIS — H00011 Hordeolum externum right upper eyelid: Secondary | ICD-10-CM | POA: Diagnosis not present

## 2019-08-28 NOTE — Progress Notes (Signed)
   [facility] Phone: [phone] Jane Marshall - 33 y.o. female MRN [id number]  Date of birth: [birth year]  Subjective:   cc: right eye pain  HPI:  Right eye.  Started yesterday.  Woke up that way. Has become worse since then.  Some tearing, no pus.  Feels like something is 'on her eye'.  No earache, headache, rhinorrhea.  Doesn't wear contacts.    ROS: See HPI for pertinent positives and negatives  Family history reviewed for today's visit. No changes.  -  reports that she quit smoking about 7 years ago. Her smoking use included cigarettes and cigars. She smoked 0.25 packs per day. She has never used smokeless tobacco.  Objective:   BP 110/62   Pulse 61   Wt 223 lb 12.8 oz (101.5 kg)   SpO2 99%   BMI 35.05 kg/m  Gen: alert and oriented. No acute distress.  HEENT: no occular discharge or tearing. No swelling of the eyelids.  No scleral injection.  Some mild redness seen on the inside of the right upper eyelid.    Assessment/Plan:   Hordeolum externum of right upper eyelid Eye pain for two days.  Reports some tearing but on exam no tearing or discharge seen on exam.  Did see some erythema on the upper right eyelid on the internal side.  Does not appear to be conjunctivitis on exam.  - warm compresses multiple times daily.   - OTC eye drops for discomfort/erythema - OTC NSAID/tylenol for pain     Clemetine Marker, MD PGY-2 [facility]

## 2019-08-28 NOTE — Patient Instructions (Signed)
It is most likely you have a hordeolum, which is more commonly called a 'stye'.  I do not believe this is conjunctivitis.  The treatment for this is to apply a warm compress to the eye for ten minutes, 4 times a day. Gently Massaging your eye in a circular motion while applying the compress can help as well. It may take a few weeks for this to resolve.  You can also get artificial tear drops over the counter to help with irritation or dryness of your eye that may occur.  You can try OTC pain meds like tylenol for relief of general pain as well.    If you ever start to lose vision in that eye you should go to the ED to get evaluated immediately.    Have a great day,   Clemetine Marker, MD

## 2019-08-28 NOTE — Telephone Encounter (Signed)
Reviewed form and placed in PCP's box for completion.  .Leydi Winstead R Conroy Goracke, CMA  

## 2019-08-28 NOTE — Telephone Encounter (Signed)
Patient came into office to drop off form to be completed by her PCP.Pt's last appointment was on 07-15-19, forms were placed in red team folder.

## 2019-09-03 DIAGNOSIS — H00011 Hordeolum externum right upper eyelid: Secondary | ICD-10-CM | POA: Insufficient documentation

## 2019-09-03 NOTE — Assessment & Plan Note (Signed)
Eye pain for two days.  Reports some tearing but on exam no tearing or discharge seen on exam.  Did see some erythema on the upper right eyelid on the internal side.  Does not appear to be conjunctivitis on exam.  - warm compresses multiple times daily.   - OTC eye drops for discomfort/erythema - OTC NSAID/tylenol for pain

## 2019-09-08 NOTE — Telephone Encounter (Signed)
Called several times to inquire more information about FMLA paperwork. Unable to reach patient. Will hold off on completing forms until I speak to patient. Thank you.

## 2019-11-13 ENCOUNTER — Other Ambulatory Visit: Payer: Self-pay

## 2019-11-13 ENCOUNTER — Encounter: Payer: Self-pay | Admitting: Family Medicine

## 2019-11-13 ENCOUNTER — Other Ambulatory Visit (HOSPITAL_COMMUNITY)
Admission: RE | Admit: 2019-11-13 | Discharge: 2019-11-13 | Disposition: A | Discharge status: Home / Self Care | Payer: BC Managed Care – PPO | Source: Ambulatory Visit | Attending: Family Medicine | Admitting: Family Medicine

## 2019-11-13 ENCOUNTER — Telehealth: Payer: Self-pay | Admitting: Family Medicine

## 2019-11-13 ENCOUNTER — Ambulatory Visit: Payer: BC Managed Care – PPO | Admitting: Family Medicine

## 2019-11-13 VITALS — BP 102/78 | HR 78 | Temp 98.1°F | Wt 217.0 lb

## 2019-11-13 DIAGNOSIS — Z202 Contact with and (suspected) exposure to infections with a predominantly sexual mode of transmission: Secondary | ICD-10-CM | POA: Diagnosis not present

## 2019-11-13 DIAGNOSIS — N898 Other specified noninflammatory disorders of vagina: Secondary | ICD-10-CM | POA: Diagnosis present

## 2019-11-13 LAB — POCT WET PREP (WET MOUNT)
Clue Cells Wet Prep Whiff POC: NEGATIVE
Trichomonas Wet Prep HPF POC: ABSENT

## 2019-11-13 MED ORDER — HYDROCORTISONE 1 % EX CREA: 1.0000 "application " | TOPICAL_CREAM | Freq: Two times a day (BID) | CUTANEOUS | 0 refills | Status: DC

## 2019-11-13 NOTE — Telephone Encounter (Signed)
Reviewed FMLA paperwork and placed in PCP's box for completion.  .Cornelius Marullo R Lealer Marsland, CMA' 

## 2019-11-13 NOTE — Telephone Encounter (Signed)
FMLA form dropped off for at front desk for completion.  Verified that patient section of form has been completed.  Last DOS/WCC with PCP was 11-13-2019.  Placed form in red team folder to be completed by clinical staff.  Jane Marshall

## 2019-11-13 NOTE — Patient Instructions (Signed)
Thank you for coming into the office today, we are glad you are safe.  Today we did a swab and some blood test to check for any sexually transmitted infections.  Please remember that you need to come back 3 months after your last potential exposure to get a more complete HIV test based on the incubation period for the HIV virus.  Also please remember for future reference, if you have a suspected exposure to HIV that it would be good to speak to a physician immediately because postexposure prophylaxis is often very effective if started within 72 hours.  I will send you my chart message with the results when they come in if they require any treatment.  Dr. Parke Simmers

## 2019-11-13 NOTE — Progress Notes (Signed)
    SUBJECTIVE:   CHIEF COMPLAINT / HPI:   Patient here for STD screening.  She says that she recently became informed that her sexual partner has been having sexual activity outside of the relationship.  Given this increased risk of exposure she would like to get tested.  She has no symptoms other than a light discharge which she says is not particularly out of the norm for her.  She does not have any rashes or sores she complains of or dysuria.  She says that she is safe and not is making her do anything she does not want to do.  She has not been sexually active with males and says that her pregnancy test is not necessary  PERTINENT  PMH / PSH: n/a  OBJECTIVE:   BP 102/78   Pulse 78   Temp 98.1 F (36.7 C) (Oral)   Wt 217 lb (98.4 kg)   LMP 11/01/2019   SpO2 99%   BMI 33.99 kg/m   General: Pleasant and alert female, answering questions appropriately, no distress Cardiac: Regular rate Respiratory: No respiratory distress, no increased work of breathing, no cough GU exam:*Performed entirely with CMA in the room at patient's consent*no visualized external pathology or lesions, scant white discharge with no sign of bleeding or undue discomfort to speculum exam.  ASSESSMENT/PLAN:   Possible exposure to STD Patient here for testing due to possible exposure to STD from partner who had recently disclosed sexual activity outside of the relationship, patient has not had sexual contact with a female so pregnancy test was declined  Ancillary cytology is still pending, initial swab was negative, HIV and RPR both negative.  We did discuss at length incubation period for HIV exposure and recommendation of retest 3 months past her last sexual encounter.  No sexual encounters in the last 72 hours so PEP was not indicated       Jane Rolling, DO [facility]

## 2019-11-14 LAB — CERVICOVAGINAL ANCILLARY ONLY
Chlamydia: NEGATIVE
Comment: NEGATIVE
Comment: NORMAL
Neisseria Gonorrhea: NEGATIVE

## 2019-11-14 LAB — RPR: RPR Ser Ql: NONREACTIVE

## 2019-11-14 LAB — HIV ANTIBODY (ROUTINE TESTING W REFLEX): HIV Screen 4th Generation wRfx: NONREACTIVE

## 2019-11-14 NOTE — Assessment & Plan Note (Signed)
Patient here for testing due to possible exposure to STD from partner who had recently disclosed sexual activity outside of the relationship, patient has not had sexual contact with a female so pregnancy test was declined  Ancillary cytology is still pending, initial swab was negative, HIV and RPR both negative.  We did discuss at length incubation period for HIV exposure and recommendation of retest 3 months past her last sexual encounter.  No sexual encounters in the last 72 hours so PEP was not indicated

## 2019-11-17 ENCOUNTER — Encounter: Payer: Self-pay | Admitting: Family Medicine

## 2019-11-21 NOTE — Telephone Encounter (Signed)
Contacted patient to inform her that her FMLA forms will need to be completed by her psychiatrist. I have placed the forms at the front for her to pick up at her convenience. Called and left message. Please contact patient again in attempt to let her know her forms are available to be picked up and she will need to drop them off at her Psychiatrists office. Thank you.

## 2020-01-05 ENCOUNTER — Other Ambulatory Visit: Payer: Self-pay

## 2020-01-05 ENCOUNTER — Encounter: Payer: Self-pay | Admitting: Emergency Medicine

## 2020-01-05 ENCOUNTER — Ambulatory Visit
Admission: EM | Admit: 2020-01-05 | Discharge: 2020-01-05 | Disposition: A | Discharge status: Home / Self Care | Payer: BC Managed Care – PPO | Attending: Physician Assistant | Admitting: Physician Assistant

## 2020-01-05 DIAGNOSIS — R197 Diarrhea, unspecified: Secondary | ICD-10-CM

## 2020-01-05 DIAGNOSIS — N898 Other specified noninflammatory disorders of vagina: Secondary | ICD-10-CM | POA: Diagnosis present

## 2020-01-05 DIAGNOSIS — R112 Nausea with vomiting, unspecified: Secondary | ICD-10-CM

## 2020-01-05 LAB — POCT URINALYSIS DIP (MANUAL ENTRY)
Bilirubin, UA: NEGATIVE
Glucose, UA: NEGATIVE mg/dL
Ketones, POC UA: NEGATIVE mg/dL
Leukocytes, UA: NEGATIVE
Nitrite, UA: NEGATIVE
Spec Grav, UA: >=1.03 — AB (ref 1.010–1.025)
Urobilinogen, UA: 0.2 E.U./dL
pH, UA: 5.5 (ref 5.0–8.0)

## 2020-01-05 LAB — POCT URINE PREGNANCY: Preg Test, Ur: NEGATIVE

## 2020-01-05 MED ORDER — ONDANSETRON 4 MG PO TBDP: 4.0000 mg | ORAL_TABLET | Freq: Three times a day (TID) | ORAL | 0 refills | Status: DC | PRN

## 2020-01-05 MED ORDER — ONDANSETRON 4 MG PO TBDP
4.0000 mg | ORAL_TABLET | Freq: Once | ORAL | Status: AC
  Administered 2020-01-05: 14:00:00 4 mg via ORAL

## 2020-01-05 MED ORDER — DICYCLOMINE HCL 20 MG PO TABS: 20.0000 mg | ORAL_TABLET | Freq: Two times a day (BID) | ORAL | 0 refills | Status: DC

## 2020-01-05 NOTE — Discharge Instructions (Signed)
Nausea vomiting diarrhea Zofran for nausea and vomiting as needed. Bentyl for abdominal cramping. Keep hydrated, you urine should be clear to pale yellow in color. Bland diet, advance as tolerated. Monitor for any worsening of symptoms, nausea or vomiting not controlled by medication, worsening abdominal pain, fever, go to the emergency department for further evaluation needed.   2. Vaginal discharge Cytology sent, you will be contacted with any positive results that requires further treatment. Refrain from sexual activity for the next 7 days.

## 2020-01-05 NOTE — ED Provider Notes (Signed)
[facility]    CSN: [id number] Arrival date & time: 01/05/20  1243      History   Chief Complaint Chief Complaint  Patient presents with  . Abdominal Pain    HPI [facility] [place] is a 34 y.o. female.   34 year old female comes in for multiple complaints  1.  Acute onset of nausea, vomiting, diarrhea since this morning.  States symptoms woke her up from sleep.  She has had intermittent left lower quadrant pain that has been waxing and waning in intensity as well.  Has had nausea with 8 episodes of nonbilious nonbloody vomit.  Has not been able to tolerate oral intake.  10+ episodes of diarrhea, denies melena, hematochezia.  Denies fever, chills.  Denies URI symptoms such as cough, congestion, sore throat.  Denies urinary symptoms such as frequency, dysuria, hematuria.  2.  Vaginal discharge, irritation x3 days.  Patient with similar symptoms 1 to 2 months ago, which resolved completely prior to current symptom onset.  States she has similar symptoms prior to cycles in the past.  However, she found out her partner had multiple partners.  LMP 11/29/2019.     Past Medical History:  Diagnosis Date  . Allergy   . Anemia    high school  . Exotropia of left eye   . GERD (gastroesophageal reflux disease)   . H. pylori infection   . Headache(784.0)   . Plantar fasciitis     Patient Active Problem List   Diagnosis Date Noted  . Possible exposure to STD 11/13/2019  . Hydradenitis 05/28/2019  . Depression 08/24/2017  . Birth control counseling 04/18/2017  . 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 GERD (gastroesophageal reflux disease) 12/15/2016  . Eczema 10/16/2014    Past Surgical History:  Procedure Laterality Date  . corrective foot surgery Bilateral   . wisdom teeth      OB History   No obstetric history on file.      Home Medications    Prior to Admission medications   Medication Sig Start Date End Date Taking? Authorizing Provider  escitalopram (LEXAPRO)  10 MG tablet Take 10 mg by mouth daily.   Yes [provider]  hydrOXYzine (ATARAX/VISTARIL) 10 MG tablet Take 10-20 mg by mouth 3 (three) times daily as needed.   Yes [provider]  Multiple Vitamin (MULTIVITAMIN ADULT PO) Take by mouth.   Yes [provider]  prazosin (MINIPRESS) 1 MG capsule Take 1-2 mg by mouth at bedtime.   Yes [provider]  betamethasone valerate ointment (VALISONE) 0.1 % Apply 1 application topically 2 (two) times daily. 03/26/19   Garnette Gunner, MD  dicyclomine (BENTYL) 20 MG tablet Take 1 tablet (20 mg total) by mouth 2 (two) times daily. 01/05/20   Cathie Hoops, Destin Vinsant V, PA-C  hydrocortisone cream 1 % Apply 1 application topically 2 (two) times daily. 11/13/19   [name], DO  ondansetron (ZOFRAN ODT) 4 MG disintegrating tablet Take 1 tablet (4 mg total) by mouth every 8 (eight) hours as needed for nausea or vomiting. 01/05/20   Belinda Fisher, PA-C    Family History Family History  Problem Relation Age of Onset  . Early death Mother   . Heart disease Mother   . Kidney disease Mother   . Cancer Father        pancreatic  . Pancreatic cancer Father   . Crohn's disease Sister   . Stomach cancer Maternal Aunt   . Colon cancer Neg Hx   .  Colon polyps Neg Hx   . Esophageal cancer Neg Hx   . Rectal cancer Neg Hx     Social History Social History   Tobacco Use  . Smoking status: Former Smoker    Packs/day: 0.25    Types: Cigarettes, Cigars    Quit date: 09/12/2011    Years since quitting: 8.3  . Smokeless tobacco: Never Used  Substance Use Topics  . Alcohol use: Yes    Alcohol/week: 14.0 standard drinks    Types: 14 Cans of beer per week    Comment: Binges on Weekend. Endorses sometimes needing "eye opener."  . Drug use: No     Allergies   Patient has no known allergies.   Review of Systems Review of Systems  Reason unable to perform ROS: See HPI as above.     Physical Exam Triage Vital Signs ED Triage Vitals  Enc  Vitals Group     BP 01/05/20 1344 114/79     Pulse Rate 01/05/20 1344 99     Resp 01/05/20 1344 16     Temp 01/05/20 1344 99.1 F (37.3 C)     Temp Source 01/05/20 1344 Oral     SpO2 01/05/20 1344 98 %     Weight --      Height --      Head Circumference --      Peak Flow --      Pain Score 01/05/20 1353 6     Pain Loc --      Pain Edu? --      Excl. in [place]? --    No data found.  Updated Vital Signs BP 114/79 (BP Location: Left Arm)   Pulse 99   Temp 99.1 F (37.3 C) (Oral)   Resp 16   LMP 11/29/2019   SpO2 98%   Physical Exam Constitutional:      General: She is not in acute distress.    Appearance: She is well-developed. She is not ill-appearing, toxic-appearing or diaphoretic.  HENT:     Head: Normocephalic and atraumatic.  Eyes:     Conjunctiva/sclera: Conjunctivae normal.     Pupils: Pupils are equal, round, and reactive to light.  Cardiovascular:     Rate and Rhythm: Normal rate and regular rhythm.  Pulmonary:     Effort: Pulmonary effort is normal. No respiratory distress.     Comments: LCTAB Abdominal:     General: Bowel sounds are normal.     Palpations: Abdomen is soft.     Tenderness: There is no abdominal tenderness. There is no right CVA tenderness, left CVA tenderness, guarding or rebound.  Musculoskeletal:     Cervical back: Normal range of motion and neck supple.  Skin:    General: Skin is warm and dry.  Neurological:     Mental Status: She is alert and oriented to person, place, and time.  Psychiatric:        Behavior: Behavior normal.        Judgment: Judgment normal.      UC Treatments / Results  Labs (all labs ordered are listed, but only abnormal results are displayed) Labs Reviewed  POCT URINALYSIS DIP (MANUAL ENTRY) - Abnormal; Notable for the following components:      Result Value   Spec Grav, UA >=1.030 (*)    Blood, UA trace-intact (*)    Protein Ur, POC trace (*)    All other components within normal limits  POCT URINE  PREGNANCY  CERVICOVAGINAL ANCILLARY ONLY  EKG   Radiology No results found.  Procedures Procedures (including critical care time)  Medications Ordered in UC Medications  ondansetron (ZOFRAN-ODT) disintegrating tablet 4 mg (has no administration in time range)    Initial Impression / Assessment and Plan / UC Course  I have reviewed the triage vital signs and the nursing notes.  Pertinent labs & imaging results that were available during my care of the patient were reviewed by me and considered in my medical decision making (see chart for details).    1.  Nausea vomiting diarrhea  No alarming signs on exam.  Abdomen nontender.  Will provide symptomatic management with Zofran and Bentyl at this time.  Return precautions given.  2.  Vaginal discharge, irritation Abdomen nontender, low suspicion for PID. Cytology sent, patient will be contacted with any positive results. Return precautions given.  Final Clinical Impressions(s) / UC Diagnoses   Final diagnoses:  Nausea vomiting and diarrhea  Vaginal discharge   ED Prescriptions    Medication Sig Dispense Auth. Provider   ondansetron (ZOFRAN ODT) 4 MG disintegrating tablet Take 1 tablet (4 mg total) by mouth every 8 (eight) hours as needed for nausea or vomiting. 20 tablet Donya Hitch V, PA-C   dicyclomine (BENTYL) 20 MG tablet Take 1 tablet (20 mg total) by mouth 2 (two) times daily. 20 tablet Belinda Fisher, PA-C     PDMP not reviewed this encounter.   Belinda Fisher, PA-C 01/05/20 1424

## 2020-01-05 NOTE — ED Triage Notes (Signed)
Abdominal pain, nausea, diarrhea, vomiting.  Symptoms woke patient during her sleep  Vaginal irritation and discharge since saturday

## 2020-01-07 ENCOUNTER — Encounter: Payer: Self-pay | Admitting: Family Medicine

## 2020-01-07 LAB — CERVICOVAGINAL ANCILLARY ONLY
Bacterial Vaginitis (gardnerella): POSITIVE — AB
Candida Glabrata: NEGATIVE
Candida Vaginitis: POSITIVE — AB
Chlamydia: NEGATIVE
Comment: NEGATIVE
Comment: NEGATIVE
Comment: NEGATIVE
Comment: NEGATIVE
Comment: NEGATIVE
Comment: NORMAL
Neisseria Gonorrhea: NEGATIVE
Trichomonas: NEGATIVE

## 2020-01-08 ENCOUNTER — Telehealth (HOSPITAL_COMMUNITY): Payer: Self-pay

## 2020-01-08 DIAGNOSIS — N898 Other specified noninflammatory disorders of vagina: Secondary | ICD-10-CM

## 2020-01-08 MED ORDER — FLUCONAZOLE 150 MG PO TABS: ORAL_TABLET | ORAL | 0 refills | Status: DC

## 2020-01-08 MED ORDER — METRONIDAZOLE 500 MG PO TABS: 500.0000 mg | ORAL_TABLET | Freq: Two times a day (BID) | ORAL | 0 refills | Status: DC

## 2020-01-08 NOTE — Telephone Encounter (Signed)
Spoke to patient.  Informed her that the physician who obtained the labs should contact her today with the results and recommended treatment options.  Recommended that she contact me if she does not hear from anyone today and I will call in the appropriate treatments.  She voiced understanding and agreement to plan

## 2020-01-08 NOTE — Telephone Encounter (Signed)
Patient contacted by phone and made aware of    results. Pt verbalized understanding and had all questions answered.  Bacterial vaginosis is positive. Pt needs treatment. Flagyl 500 mg BID x 7 days #14 no refills sent to patients pharmacy of choice.    Pt also positive for yeast.  Diflucan 150mg  #2 sent to pharmacy.    [facility].  Pt requests [facility] to [facility] [address]  Pharmacy contacted and will transfer Rx.

## 2020-01-31 ENCOUNTER — Encounter: Payer: Self-pay | Admitting: Family Medicine

## 2020-02-03 ENCOUNTER — Encounter: Payer: Self-pay | Admitting: Family Medicine

## 2020-03-02 ENCOUNTER — Other Ambulatory Visit: Payer: Self-pay

## 2020-03-02 ENCOUNTER — Encounter: Payer: Self-pay | Admitting: Family Medicine

## 2020-03-02 ENCOUNTER — Ambulatory Visit (INDEPENDENT_AMBULATORY_CARE_PROVIDER_SITE_OTHER): Payer: BC Managed Care – PPO | Admitting: Family Medicine

## 2020-03-02 VITALS — BP 92/70 | HR 75 | Ht 67.0 in | Wt 211.1 lb

## 2020-03-02 DIAGNOSIS — L301 Dyshidrosis [pompholyx]: Secondary | ICD-10-CM | POA: Diagnosis not present

## 2020-03-02 DIAGNOSIS — L732 Hidradenitis suppurativa: Secondary | ICD-10-CM | POA: Diagnosis not present

## 2020-03-02 MED ORDER — DOXYCYCLINE HYCLATE 100 MG PO TABS: 100.0000 mg | ORAL_TABLET | Freq: Two times a day (BID) | ORAL | 0 refills | Status: AC

## 2020-03-02 MED ORDER — TRIAMCINOLONE ACETONIDE 0.1 % EX CREA: 1.0000 "application " | TOPICAL_CREAM | Freq: Two times a day (BID) | CUTANEOUS | 2 refills | Status: DC | PRN

## 2020-03-02 MED ORDER — DOXYCYCLINE HYCLATE 50 MG PO CAPS: 50.0000 mg | ORAL_CAPSULE | Freq: Two times a day (BID) | ORAL | 0 refills | Status: AC

## 2020-03-02 MED ORDER — NORGESTIMATE-ETH ESTRADIOL 0.25-35 MG-MCG PO TABS: 1.0000 | ORAL_TABLET | Freq: Every day | ORAL | 11 refills | Status: DC

## 2020-03-02 NOTE — Patient Instructions (Addendum)
Thank you for coming to see me today. It was a pleasure to see you.   Take Doxycycline 100mg : Twice a day for 10 days for current infection Start birth control immediately. Wear a condom for at least 5-7 days of starting the birth control to prevent pregnancy until birth control can become effective.   I have also prescribed you a trial of Doxycycline 50mg : take twice a day starting one week prior to your menstrual cycle to help prevent a flare.   If you have any questions or concerns, please do not hesitate to call the office at [phone].  Take Care,  Dr. , DO Resident Physician [facility] [phone]

## 2020-03-02 NOTE — Progress Notes (Signed)
Subjective:   Patient ID: Jane Marshall    DOB: 04/28/1986, 34 y.o. female   MRN: [id number]  Jane Marshall is a 34 y.o. female with a history of GERD, eczema/dyshidrotic eczema, hydradenitis, and depression here for "recurrent abscesses".  Recurrent Abscesses:  Patient with a history of hydradenitis. She notes that they flare up with menstrual cycle. The abscesses primarily occur on her buttocks. They are painful and slightly warm. No discharge or drainage. Denies fevers or chills. LMP 01/27/2020, regular.  Rash on Hands and Feet: Small vesicles that are severely itchy which then leads to a more diffuse rash. Has occurred off and on for years.   Review of Systems:  Per HPI.   Objective:   BP 92/70   Pulse 75   Ht 5\' 7"  (1.702 m)   Wt 211 lb 2 oz (95.8 kg)   SpO2 99%   BMI 33.07 kg/m  Vitals and nursing note reviewed.  General: pleasant younger female, sitting comfortably in exam chair, well nourished, well developed, in no acute distress with non-toxic appearance Resp: Breathing comfortably on room air, speaking in full sentences Skin: warm, dry, Pinpoint vesicles along lateral aspects of fingers and dorsum of hand, as well as dorsal lateral sides of feet with surrounding hyperpigmentation, no significant erythema and no warmth; 3x3cm fluctuant boil on right medial gluteal celft, warm and erythematous, scattered small boils along buttocks without signs fo infection Extremities: warm and well perfused, normal tone BCW:UGQB normal Neuro: Alert and oriented, speech normal         Assessment & Plan:   Dyshidrotic eczema Physical exam appears most consistent with dyshidrotic eczema.  Will trial triamcinolone 0.1% cream twice daily until resolved and then as needed.  Patient to RTC if no improvement or worsening.  Can consider KOH to rule out fungal at that time.  Hydradenitis Acute on chronic.  Appears to be related to menstrual cycle.  Patient is not currently on birth  control.  She has one area of fluctuance with surrounding erythema and warmth to left internal gluteal cleft. No discharge. Will treat current outbreak with doxycycline 100 mg twice daily x10 days.  Given relationship with menstrual cycle and fetal toxicity of doxycycline, will start patient on birth control to prevent pregnancy and help control flares.  Patient was acceptable to this plan.  Will also do a trial of low-dose doxycycline 50 mg twice daily to be taken 5 days prior to her menstrual cycle through to the end of her menstrual cycle.  Will recommend trial of Hibiclens daily to help with disinfect the area.  Can consider trial of clindamycin ointment to axilla and buttocks if no improvement with above. -Start doxycycline 100 mg twice daily x10 days -Start Sprintec birth control -Patient to trial low-dose doxycycline 50 mg twice daily.  Instructed to start 5 days prior to her menstrual cycle to help prevent flare. -Recommend daily cleansing of areas with Hibiclens -Consider clindamycin ointment if no improvement with above -Follow-up scheduled for 1 month to evaluate for improvement, sooner if worsening.  No orders of the defined types were placed in this encounter.  Meds ordered this encounter  Medications  . triamcinolone cream (KENALOG) 0.1 %    Sig: Apply 1 application topically 2 (two) times daily as needed.    Dispense:  80 g    Refill:  2  . doxycycline (VIBRA-TABS) 100 MG tablet    Sig: Take 1 tablet (100 mg total) by mouth 2 (two) times  daily for 10 days.    Dispense:  20 tablet    Refill:  0  . norgestimate-ethinyl estradiol (SPRINTEC 28) 0.25-35 MG-MCG tablet    Sig: Take 1 tablet by mouth daily.    Dispense:  28 tablet    Refill:  11  . doxycycline (VIBRAMYCIN) 50 MG capsule    Sig: Take 1 capsule (50 mg total) by mouth 2 (two) times daily for 14 days. Start 1 week prior to menstrual cycle.    Dispense:  28 capsule    Refill:  0    Orpah Cobb, DO PGY-2, [facility] 03/04/2020 1:30 PM

## 2020-03-04 NOTE — Assessment & Plan Note (Signed)
Physical exam appears most consistent with dyshidrotic eczema.  Will trial triamcinolone 0.1% cream twice daily until resolved and then as needed.  Patient to RTC if no improvement or worsening.  Can consider KOH to rule out fungal at that time.

## 2020-03-04 NOTE — Assessment & Plan Note (Addendum)
Acute on chronic.  Appears to be related to menstrual cycle.  Patient is not currently on birth control.  She has one area of fluctuance with surrounding erythema and warmth to left internal gluteal cleft. No discharge. Will treat current outbreak with doxycycline 100 mg twice daily x10 days.  Given relationship with menstrual cycle and fetal toxicity of doxycycline, will start patient on birth control to prevent pregnancy and help control flares.  Patient was acceptable to this plan.  Will also do a trial of low-dose doxycycline 50 mg twice daily to be taken 5 days prior to her menstrual cycle through to the end of her menstrual cycle.  Will recommend trial of Hibiclens daily to help with disinfect the area.  Can consider trial of clindamycin ointment to axilla and buttocks if no improvement with above. -Start doxycycline 100 mg twice daily x10 days -Start Sprintec birth control -Patient to trial low-dose doxycycline 50 mg twice daily.  Instructed to start 5 days prior to her menstrual cycle to help prevent flare. -Recommend daily cleansing of areas with Hibiclens -Consider clindamycin ointment if no improvement with above -Follow-up scheduled for 1 month to evaluate for improvement, sooner if worsening.

## 2020-03-27 ENCOUNTER — Encounter: Payer: Self-pay | Admitting: Family Medicine

## 2020-03-29 ENCOUNTER — Encounter: Payer: Self-pay | Admitting: Emergency Medicine

## 2020-03-29 ENCOUNTER — Ambulatory Visit
Admission: EM | Admit: 2020-03-29 | Discharge: 2020-03-29 | Disposition: A | Discharge status: Home / Self Care | Payer: BC Managed Care – PPO | Attending: Physician Assistant | Admitting: Physician Assistant

## 2020-03-29 ENCOUNTER — Other Ambulatory Visit: Payer: Self-pay

## 2020-03-29 DIAGNOSIS — B373 Candidiasis of vulva and vagina: Secondary | ICD-10-CM | POA: Insufficient documentation

## 2020-03-29 DIAGNOSIS — Z79899 Other long term (current) drug therapy: Secondary | ICD-10-CM | POA: Insufficient documentation

## 2020-03-29 DIAGNOSIS — N898 Other specified noninflammatory disorders of vagina: Secondary | ICD-10-CM | POA: Diagnosis present

## 2020-03-29 DIAGNOSIS — Z87891 Personal history of nicotine dependence: Secondary | ICD-10-CM | POA: Insufficient documentation

## 2020-03-29 DIAGNOSIS — B3731 Acute candidiasis of vulva and vagina: Secondary | ICD-10-CM

## 2020-03-29 MED ORDER — FLUCONAZOLE 150 MG PO TABS
ORAL_TABLET | ORAL | 0 refills | Status: DC
Start: 2020-03-29 — End: 2020-04-14

## 2020-03-29 NOTE — ED Provider Notes (Signed)
[facility]    CSN: [id number] Arrival date & time: 03/29/20  1015      History   Chief Complaint Chief Complaint  Patient presents with  . Vaginal Itching    HPI Jane Marshall is a 34 y.o. female.   34 year old female comes in for 4 day history of vaginal irritation/itching. Has had white discharge. Denies urinary symptoms, abdominal symptoms. Denies fever. Started on doxycycline 50mg  BID for HS 3 days ago. Also started new soap. LMP 02/29/2020, on OCPs. Sexually active one new female partner.      Past Medical History:  Diagnosis Date  . Allergy   . Anemia    high school  . Exotropia of left eye   . GERD (gastroesophageal reflux disease)   . H. pylori infection   . Headache(784.0)   . Plantar fasciitis     Patient Active Problem List   Diagnosis Date Noted  . Hydradenitis 05/28/2019  . Depression 08/24/2017  . 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 GERD (gastroesophageal reflux disease) 12/15/2016    Past Surgical History:  Procedure Laterality Date  . corrective foot surgery Bilateral   . wisdom teeth      OB History   No obstetric history on file.      Home Medications    Prior to Admission medications   Medication Sig Start Date End Date Taking? Authorizing Provider  doxycycline (VIBRAMYCIN) 50 MG capsule Take 50 mg by mouth 2 (two) times daily.   Yes [provider]  dicyclomine (BENTYL) 20 MG tablet Take 1 tablet (20 mg total) by mouth 2 (two) times daily. 01/05/20   01/07/20, Lester Crickenberger V, PA-C  escitalopram (LEXAPRO) 10 MG tablet Take 10 mg by mouth daily.    [provider]  fluconazole (DIFLUCAN) 150 MG tablet Take second dose in 72 hours. Take last pill 04/05/2020 if symptoms persist. 03/29/20   03/31/20, Bosco Paparella V, PA-C  hydrOXYzine (ATARAX/VISTARIL) 10 MG tablet Take 10-20 mg by mouth 3 (three) times daily as needed.    [provider]  Multiple Vitamin (MULTIVITAMIN ADULT PO) Take by mouth.    [provider]   norgestimate-ethinyl estradiol (SPRINTEC 28) 0.25-35 MG-MCG tablet Take 1 tablet by mouth daily. 03/02/20   Mullis, Kiersten P, DO  prazosin (MINIPRESS) 1 MG capsule Take 1-2 mg by mouth at bedtime.    [provider]  triamcinolone cream (KENALOG) 0.1 % Apply 1 application topically 2 (two) times daily as needed. 03/02/20   Mullis, 03/04/20, DO    Family History Family History  Problem Relation Age of Onset  . Early death Mother   . Heart disease Mother   . Kidney disease Mother   . Cancer Father        pancreatic  . Pancreatic cancer Father   . Crohn's disease Sister   . Stomach cancer Maternal Aunt   . Colon cancer Neg Hx   . Colon polyps Neg Hx   . Esophageal cancer Neg Hx   . Rectal cancer Neg Hx     Social History Social History   Tobacco Use  . Smoking status: Former Smoker    Packs/day: 0.25    Types: Cigarettes, Cigars    Quit date: 09/12/2011    Years since quitting: 8.5  . Smokeless tobacco: Never Used  Substance Use Topics  . Alcohol use: Yes    Alcohol/week: 14.0 standard drinks    Types: 14 Cans of beer per week    Comment:  Binges on Weekend. Endorses sometimes needing "eye opener."  . Drug use: No     Allergies   Patient has no known allergies.   Review of Systems Review of Systems  Reason unable to perform ROS: See HPI as above.     Physical Exam Triage Vital Signs ED Triage Vitals  Enc Vitals Group     BP 03/29/20 1046 139/87     Pulse Rate 03/29/20 1046 79     Resp 03/29/20 1046 18     Temp 03/29/20 1046 98.4 F (36.9 C)     Temp Source 03/29/20 1046 Oral     SpO2 03/29/20 1046 98 %     Weight --      Height --      Head Circumference --      Peak Flow --      Pain Score 03/29/20 1047 10     Pain Loc --      Pain Edu? --      Excl. in GC? --    No data found.  Updated Vital Signs BP 139/87 (BP Location: Left Arm)   Pulse 79   Temp 98.4 F (36.9 C) (Oral)   Resp 18   LMP 02/29/2020   SpO2 98%   Visual  Acuity Right Eye Distance:   Left Eye Distance:   Bilateral Distance:    Right Eye Near:   Left Eye Near:    Bilateral Near:     Physical Exam Exam conducted with a chaperone present.  Constitutional:      General: She is not in acute distress.    Appearance: Normal appearance. She is well-developed. She is not toxic-appearing or diaphoretic.  HENT:     Head: Normocephalic and atraumatic.  Eyes:     Conjunctiva/sclera: Conjunctivae normal.     Pupils: Pupils are equal, round, and reactive to light.  Pulmonary:     Effort: Pulmonary effort is normal. No respiratory distress.  Genitourinary:    Comments: No rashes, erythema to bilateral vulva. White clumpy discharge to the vaginal canal. No discharge to the cervix.  Musculoskeletal:     Cervical back: Normal range of motion and neck supple.  Skin:    General: Skin is warm and dry.  Neurological:     Mental Status: She is alert and oriented to person, place, and time.      UC Treatments / Results  Labs (all labs ordered are listed, but only abnormal results are displayed) Labs Reviewed  CERVICOVAGINAL ANCILLARY ONLY    EKG   Radiology No results found.  Procedures Procedures (including critical care time)  Medications Ordered in UC Medications - No data to display  Initial Impression / Assessment and Plan / UC Course  I have reviewed the triage vital signs and the nursing notes.  Pertinent labs & imaging results that were available during my care of the patient were reviewed by me and considered in my medical decision making (see chart for details).    Exam consistent with yeast. Will treat with diflucan. Given on abx, will provide extended course at this time. Cytology sent. Return precautions given.  Final Clinical Impressions(s) / UC Diagnoses   Final diagnoses:  Yeast vaginitis    ED Prescriptions    Medication Sig Dispense Auth. Provider   fluconazole (DIFLUCAN) 150 MG tablet Take second dose in  72 hours. Take last pill 04/05/2020 if symptoms persist. 3 tablet Belinda Fisher, PA-C     PDMP not reviewed this  encounter.   Belinda Fisher, PA-C 03/29/20 1108

## 2020-03-29 NOTE — ED Triage Notes (Addendum)
Patient presents to [facility] for assessment of vaginal irritation x 4 days.  Patient c/o white discharge.  Asking for a pelvic exam.

## 2020-03-29 NOTE — Discharge Instructions (Signed)
You were treated empirically for yeast. Start diflucan as directed. Cytology sent, you will be contacted with any positive results that requires further treatment. Refrain from sexual activity for the next 7 days. Monitor for any worsening of symptoms, fever, abdominal pain, nausea, vomiting, to follow up for reevaluation.

## 2020-03-30 LAB — CERVICOVAGINAL ANCILLARY ONLY
Chlamydia: NEGATIVE
Comment: NEGATIVE
Comment: NEGATIVE
Comment: NORMAL
Neisseria Gonorrhea: NEGATIVE
Trichomonas: NEGATIVE

## 2020-03-31 ENCOUNTER — Ambulatory Visit: Payer: BC Managed Care – PPO | Admitting: Family Medicine

## 2020-03-31 ENCOUNTER — Other Ambulatory Visit: Payer: Self-pay

## 2020-03-31 ENCOUNTER — Encounter: Payer: Self-pay | Admitting: Family Medicine

## 2020-03-31 VITALS — BP 116/68 | HR 69 | Ht 67.0 in | Wt 217.0 lb

## 2020-03-31 DIAGNOSIS — L732 Hidradenitis suppurativa: Secondary | ICD-10-CM

## 2020-03-31 DIAGNOSIS — L301 Dyshidrosis [pompholyx]: Secondary | ICD-10-CM | POA: Diagnosis not present

## 2020-03-31 DIAGNOSIS — R21 Rash and other nonspecific skin eruption: Secondary | ICD-10-CM | POA: Diagnosis not present

## 2020-03-31 DIAGNOSIS — M21611 Bunion of right foot: Secondary | ICD-10-CM | POA: Diagnosis not present

## 2020-03-31 DIAGNOSIS — M21612 Bunion of left foot: Secondary | ICD-10-CM

## 2020-03-31 LAB — POCT SKIN KOH: Skin KOH, POC: NEGATIVE

## 2020-03-31 MED ORDER — TRIAMCINOLONE ACETONIDE 0.1 % EX CREA: 1.0000 "application " | TOPICAL_CREAM | Freq: Two times a day (BID) | CUTANEOUS | 2 refills | Status: DC | PRN

## 2020-03-31 MED ORDER — DOXYCYCLINE HYCLATE 50 MG PO CAPS: 50.0000 mg | ORAL_CAPSULE | Freq: Two times a day (BID) | ORAL | 1 refills | Status: DC

## 2020-03-31 NOTE — Progress Notes (Signed)
Subjective:   Patient ID: Jane Marshall    DOB: [birth year], 34 y.o. female   MRN: [id number]  [facility] [place] is a 34 y.o. female with a history of GERD, dyshidrotic eczema, hydradenitis, depression here for hydradenitis follow up.  Hydradenitis: Patient last seen on 03/04/2020 with acute on chronic hidradenitis along her buttocks bilaterally.  She was started on 10-day course of doxycycline and Sprintec for birth control as flares appeared to be related to her menstrual cycles.  She was also instructed do a trial of low-dose doxycycline 50 mg twice daily 5 days prior to her menstrual cycle through the end of her menstrual cycle.  She was also recommended to clean the area with Hibiclens.  Today she notes flares are much improved-she still has lesions but they are much less of them and occur far less often.  She notes that she has been using the Hibiclens twice a day which has helped a lot.  She notes her last menstrual period was 02/29/2020.  She denies any lesions/flareup between periods.   Rash on Feet Bilaterally: Patient endorses a rash on the bottom and sides of her feet bilaterally for months.  She notes that it is very itchy.  Denies any redness swelling or bleeding.  He was noted to have dyshidrotic eczema on her hands and has been treating her feet with triamcinolone cream.  She notes that the triamcinolone helps with the itch tends to improve but other areas will pop up.   Review of Systems:  Per HPI.   Objective:   BP 116/68   Pulse 69   Ht 5\' 7"  (1.702 m)   Wt 217 lb (98.4 kg)   LMP 02/29/2020 (Exact Date) Comment: on OCP  SpO2 99%   BMI 33.99 kg/m  Vitals and nursing note reviewed.  General: Pleasant young female, sitting comfortably in exam chair, well nourished, well developed, in no acute distress with non-toxic appearance Resp: Breathing comfortably on room air, speaking in full sentences Skin: warm, dry, area of underlying fluctuance of about 1cmx1cm on lower right  buttocks with surrounding erythema, only mildly increased warmth, no draining or bleeding, painful to palpation. Spots scattered along bilateral buttocks indicative of prior lesions that appear well healed and without signs of infection, nontender to palpation.Two small scabbed area at lateral upper hip bilaterally, no underlying fluctuance, overlying erythema or warmth, mildly tender to palpation. Hyperpigmented macules ranging from 0.1cm to 1.0 cm on plantar surface and sides of feet bilaterally, no erythema or other signs of infection Extremities: warm and well perfused, normal tone MSK: gait normal Neuro: Alert and oriented, speech normal      Assessment & Plan:   Hydradenitis Chronic, improving. Less frequent and severe flares since starting low dose Doxycycline, birth control, and Hibaclins. Currently has 2-3 painful lesions.  - transition to continuous daily low dose Doxy 50mg  BID x 2 months - continue OCP as prescribed - continue Hibaclinz BID - follow up in 2 months for evaluation of improvement - given association with metabolic syndrome, will obtain A1C, CMP, lipid panel at follow up for screening purposes  - trial Epiduo to area qHS   Rash of foot Chronic, unclear etiology. Pruritic hyperpigmented rash on plantar surface and sides of feet that respond and resolve with steroid cream, then recur. KOH negative for fungal etiology. Does have a history of dyshidrotic eczema on hands, thus this could be an atypical presentation to atopic dermatitis. Recommend continuing current treatment with steroid cream given  improvement/resolution, daily to BID moisturization. RTC in two months, sooner if worsening, if still present can consider biopsy vs referral to dermatology at that time.  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  History of surgical correction in 2002 by Dr. Eulah Pont. Referral to podiatry for further evaluation and possible surgical  correction  Dyshidrotic eczema Much improved and almost completely resolved with Triamcinolone cream. - continue steroid cream PRN  Orders Placed This Encounter  Procedures  . Ambulatory referral to Podiatry    Referral Priority:   Routine    Referral Type:   Consultation    Referral Reason:   Specialty Services Required    Requested Specialty:   Podiatry    Number of Visits Requested:   1  . POCT Skin KOH   Meds ordered this encounter  Medications  . doxycycline (VIBRAMYCIN) 50 MG capsule    Sig: Take 1 capsule (50 mg total) by mouth 2 (two) times daily.    Dispense:  60 capsule    Refill:  1  . triamcinolone cream (KENALOG) 0.1 %    Sig: Apply 1 application topically 2 (two) times daily as needed.    Dispense:  160 g    Refill:  2  . Adapalene-Benzoyl Peroxide (EPIDUO) 0.1-2.5 % gel    Sig: Apply 1 application topically at bedtime.    Dispense:  90 g    Refill:  1    Orpah Cobb, DO PGY-3, [facility] 04/04/2020 8:18 PM

## 2020-03-31 NOTE — Assessment & Plan Note (Addendum)
Chronic, improving. Less frequent and severe flares since starting low dose Doxycycline, birth control, and Hibaclins. Currently has 2-3 painful lesions.  - transition to continuous daily low dose Doxy 50mg  BID x 2 months - continue OCP as prescribed - continue Hibaclinz BID - follow up in 2 months for evaluation of improvement - given association with metabolic syndrome, will obtain A1C, CMP, lipid panel at follow up for screening purposes  - trial Epiduo to area qHS

## 2020-03-31 NOTE — Patient Instructions (Signed)
Thank you for coming to see me today. It was a pleasure to see you.   For your hydradenitis: Take Doxycycline 50mg  twice a day, everyday for the next 2 months. Lets follow up in Mid September to see how things are going.  Continue Hibiclinz 1-2x/day I will call you with recommendation on another cream we can try Continue your birth control  Continue the Triamcinolone for the rash on your foot. I will do more research and see if I can come up with anything else to help you.  I will call you with the results of the fungal test.  Please follow-up with Dr. October in September  If you have any questions or concerns, please do not hesitate to call the office at [phone].  Take Care,  Dr. [phone], DO Resident Physician [facility] [phone]

## 2020-04-04 MED ORDER — ADAPALENE-BENZOYL PEROXIDE 0.1-2.5 % EX GEL: 1.0000 "application " | Freq: Every day | CUTANEOUS | 1 refills | Status: DC

## 2020-04-04 NOTE — Assessment & Plan Note (Addendum)
Chronic. Significant hallux valgus deformity.  History of surgical correction in 2002 by Dr. Eulah Pont. Referral to podiatry for further evaluation and possible surgical correction

## 2020-04-04 NOTE — Assessment & Plan Note (Signed)
Much improved and almost completely resolved with Triamcinolone cream. - continue steroid cream PRN

## 2020-04-04 NOTE — Assessment & Plan Note (Signed)
Chronic, unclear etiology. Pruritic hyperpigmented rash on plantar surface and sides of feet that respond and resolve with steroid cream, then recur. KOH negative for fungal etiology. Does have a history of dyshidrotic eczema on hands, thus this could be an atypical presentation to atopic dermatitis. Recommend continuing current treatment with steroid cream given improvement/resolution, daily to BID moisturization. RTC in two months, sooner if worsening, if still present can consider biopsy vs referral to dermatology at that time.

## 2020-04-12 ENCOUNTER — Encounter: Payer: Self-pay | Admitting: Family Medicine

## 2020-04-14 ENCOUNTER — Other Ambulatory Visit: Payer: Self-pay | Admitting: Family Medicine

## 2020-04-14 DIAGNOSIS — B3731 Acute candidiasis of vulva and vagina: Secondary | ICD-10-CM

## 2020-04-14 MED ORDER — FLUCONAZOLE 150 MG PO TABS: ORAL_TABLET | ORAL | 0 refills | Status: DC

## 2020-04-15 ENCOUNTER — Ambulatory Visit (INDEPENDENT_AMBULATORY_CARE_PROVIDER_SITE_OTHER): Payer: BC Managed Care – PPO

## 2020-04-15 ENCOUNTER — Ambulatory Visit: Payer: BC Managed Care – PPO | Admitting: Podiatry

## 2020-04-15 ENCOUNTER — Other Ambulatory Visit: Payer: Self-pay

## 2020-04-15 ENCOUNTER — Encounter: Payer: Self-pay | Admitting: Podiatry

## 2020-04-15 VITALS — Temp 97.3°F

## 2020-04-15 DIAGNOSIS — M2011 Hallux valgus (acquired), right foot: Secondary | ICD-10-CM

## 2020-04-15 DIAGNOSIS — M2041 Other hammer toe(s) (acquired), right foot: Secondary | ICD-10-CM | POA: Diagnosis not present

## 2020-04-15 DIAGNOSIS — M216X9 Other acquired deformities of unspecified foot: Secondary | ICD-10-CM

## 2020-04-15 DIAGNOSIS — M2012 Hallux valgus (acquired), left foot: Secondary | ICD-10-CM | POA: Diagnosis not present

## 2020-04-15 DIAGNOSIS — M2042 Other hammer toe(s) (acquired), left foot: Secondary | ICD-10-CM

## 2020-04-15 NOTE — Progress Notes (Signed)
Subjective:   Patient ID: Jane Marshall, female   DOB: 34 y.o.   MRN: [id number]   HPI Patient states she had bunion surgery approximately 20 years ago and has developed significant reoccurrence left over right and makes it hard for her to walk or wear shoe gear.  She states the pain is intensifying and she is tried different modalities without relief and she is interested in having something done especially to the left foot.  Patient does not smoke likes to be active   Review of Systems  All other systems reviewed and are negative.       Objective:  Physical Exam Vitals and nursing note reviewed.  Constitutional:      Appearance: She is well-developed.  Pulmonary:     Effort: Pulmonary effort is normal.  Musculoskeletal:        General: Normal range of motion.  Skin:    General: Skin is warm.  Neurological:     Mental Status: She is alert.     Neurovascular status was found to be intact muscle strength was found to be adequate range of motion within normal limits.  Patient has severe lateral rotation of the big toe left with elevation second digit exquisite discomfort of the second MPJ digital deformities of the third and fourth toes with previous surgery on the first metatarsal with where it looks like base type surgery.  Patient's right foot is also deformed but not to the same degree and there is significant family history of condition.  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  Due to the longstanding nature and failure to respond to previous surgery of years ago surgical intervention is indicated.  I did discuss this is a difficult case and that it is revisional surgery there is  no guarantee as to what will be able to do but I am very hopeful we can make a big difference for her and she is amenable to this and wants surgery.  At this point I allowed her to read consent form going over alternative treatments complications and she understands all of this wants surgery signed consent form after extensive review.  She understands total recovery will take 6 months to 1 year and she will have pins in the second and third toes and she will have rotation big toe second toe with shortening of the second metatarsal.  Answered all questions to the best of my ability dispensed air fracture walker with all instructions on usage and scheduled for outpatient procedure and encouraged her to call with questions.  X-rays indicate significant elevation of the possible angle left over right hallux interphalangeus deformity left over right and elongated digits along with elongated second metatarsal left over right

## 2020-04-15 NOTE — Patient Instructions (Addendum)
Bunion  A bunion is a bump on the base of the big toe that forms when the bones of the big toe joint move out of position. Bunions may be small at first, but they often get larger over time. They can make walking painful. What are the causes? A bunion may be caused by:  Wearing narrow or pointed shoes that force the big toe to press against the other toes.  Abnormal foot development that causes the foot to roll inward (pronate).  Changes in the foot that are caused by certain diseases, such as rheumatoid arthritis or polio.  A foot injury. What increases the risk? The following factors may make you more likely to develop this condition:  Wearing shoes that squeeze the toes together.  Having certain diseases, such as: ? Rheumatoid arthritis. ? Polio. ? Cerebral palsy.  Having family members who have bunions.  Being born with a foot deformity, such as flat feet or low arches.  Doing activities that put a lot of pressure on the feet, such as ballet dancing. What are the signs or symptoms? The main symptom of a bunion is a noticeable bump on the big toe. Other symptoms may include:  Pain.  Swelling around the big toe.  Redness and inflammation.  Thick or hardened skin on the big toe or between the toes.  Stiffness or loss of motion in the big toe.  Trouble with walking. How is this diagnosed? A bunion may be diagnosed based on your symptoms, medical history, and activities. You may have tests, such as:  X-rays. These allow your health care provider to check the position of the bones in your foot and look for damage to your joint. They also help your health care provider determine the severity of your bunion and the best way to treat it.  Joint aspiration. In this test, a sample of fluid is removed from the toe joint. This test may be done if you are in a lot of pain. It helps rule out diseases that cause painful swelling of the joints, such as arthritis. How is this  treated? Treatment depends on the severity of your symptoms. The goal of treatment is to relieve symptoms and prevent the bunion from getting worse. Your health care provider may recommend:  Wearing shoes that have a wide toe box.  Using bunion pads to cushion the affected area.  Taping your toes together to keep them in a normal position.  Placing a device inside your shoe (orthotics) to help reduce pressure on your toe joint.  Taking medicine to ease pain, inflammation, and swelling.  Applying heat or ice to the affected area.  Doing stretching exercises.  Surgery to remove scar tissue and move the toes back into their normal position. This treatment is rare. Follow these instructions at home: Managing pain, stiffness, and swelling   If directed, put ice on the painful area: ? Put ice in a plastic bag. ? Place a towel between your skin and the bag. ? Leave the ice on for 20 minutes, 2-3 times a day. Activity   If directed, apply heat to the affected area before you exercise. Use the heat source that your health care provider recommends, such as a moist heat pack or a heating pad. ? Place a towel between your skin and the heat source. ? Leave the heat on for 20-30 minutes. ? Remove the heat if your skin turns bright red. This is especially important if you are unable to feel pain,   heat, or cold. You may have a greater risk of getting burned.  Do exercises as told by your health care provider. General instructions  Support your toe joint with proper footwear, shoe padding, or taping as told by your health care provider.  Take over-the-counter and prescription medicines only as told by your health care provider.  Keep all follow-up visits as told by your health care provider. This is important. Contact a health care provider if your symptoms:  Get worse.  Do not improve in 2 weeks. Get help right away if you have:  Severe pain and trouble with walking. Summary  A  bunion is a bump on the base of the big toe that forms when the bones of the big toe joint move out of position.  Bunions can make walking painful.  Treatment depends on the severity of your symptoms.  Support your toe joint with proper footwear, shoe padding, or taping as told by your health care provider. This information is not intended to replace advice given to you by your health care provider. Make sure you discuss any questions you have with your health care provider. Document Revised: 03/04/2018 Document Reviewed: 01/08/2018 Elsevier Patient Education  2020 Elsevier Inc.    Hammer Toe  Hammer toe is a change in the shape (a deformity) of your toe. The deformity causes the middle joint of your toe to stay bent. This causes pain, especially when you are wearing shoes. Hammer toe starts gradually. At first, the toe can be straightened. Gradually over time, the deformity becomes stiff and permanent. Early treatments to keep the toe straight may relieve pain. As the deformity becomes stiff and permanent, surgery may be needed to straighten the toe. What are the causes? Hammer toe is caused by abnormal bending of the toe joint that is closest to your foot. It happens gradually over time. This pulls on the muscles and connections (tendons) of the toe joint, making them weak and stiff. It is often related to wearing shoes that are too short or narrow and do not let your toes straighten. What increases the risk? You may be at greater risk for hammer toe if you:  Are female.  Are older.  Wear shoes that are too small.  Wear high-heeled shoes that pinch your toes.  Are a ballet dancer.  Have a second toe that is longer than your big toe (first toe).  Injure your foot or toe.  Have arthritis.  Have a family history of hammer toe.  Have a nerve or muscle disorder. What are the signs or symptoms? The main symptoms of this condition are pain and deformity of the toe. The pain is  worse when wearing shoes, walking, or running. Other symptoms may include:  Corns or calluses over the bent part of the toe or between the toes.  Redness and a burning feeling on the toe.  An open sore that forms on the top of the toe.  Not being able to straighten the toe. How is this diagnosed? This condition is diagnosed based on your symptoms and a physical exam. During the exam, your health care provider will try to straighten your toe to see how stiff the deformity is. You may also have tests, such as:  A blood test to check for rheumatoid arthritis.  An X-ray to show how severe the deformity is. How is this treated? Treatment for this condition will depend on how stiff the deformity is. Surgery is often needed. However, sometimes a hammer   toe can be straightened without surgery. Treatments that do not involve surgery include:  Taping the toe into a straightened position.  Using pads and cushions to protect the toe (orthotics).  Wearing shoes that provide enough room for the toes.  Doing toe-stretching exercises at home.  Taking an NSAID to reduce pain and swelling. If these treatments do not help or the toe cannot be straightened, surgery is the next option. The most common surgeries used to straighten a hammer toe include:  Arthroplasty. In this procedure, part of the joint is removed, and that allows the toe to straighten.  Fusion. In this procedure, cartilage between the two bones of the joint is taken out and the bones are fused together into one longer bone.  Implantation. In this procedure, part of the bone is removed and replaced with an implant to let the toe move again.  Flexor tendon transfer. In this procedure, the tendons that curl the toes down (flexor tendons) are repositioned. Follow these instructions at home:  Take over-the-counter and prescription medicines only as told by your health care provider.  Do toe straightening and stretching exercises as  told by your health care provider.  Keep all follow-up visits as told by your health care provider. This is important. How is this prevented?  Wear shoes that give your toes enough room and do not cause pain.  Do not wear high-heeled shoes. Contact a health care provider if:  Your pain gets worse.  Your toe becomes red or swollen.  You develop an open sore on your toe. This information is not intended to replace advice given to you by your health care provider. Make sure you discuss any questions you have with your health care provider. Document Revised: 08/10/2017 Document Reviewed: 12/22/2015 Elsevier Patient Education  2020 Elsevier Inc.  

## 2020-04-25 ENCOUNTER — Telehealth: Payer: Self-pay | Admitting: *Deleted

## 2020-04-25 NOTE — Telephone Encounter (Signed)
DOS 05/04/2020 [facility] BUNIONECTOM[facility] - LT, AIKEN OSTEOTOMY - [phone], METATARSAL OSTEOTOMY 2ND - 10301, AND HAMMER TOE REPAIR 2,3 WITH PIN AND 4TH DISTAL - [phone] OF THE LEFT FOOT  BCBS: Policy Effective 09/12/2019 - 09/10/9998  Deductible ($1,250.00) / $0.00 Paid / $1,250.00 to go Out-of-Pocket Limit ($4,890.00) / $855.40 Paid / $4,034.60 to go Co-insurance / 20%  Authorization is NOT REQUIRED.

## 2020-04-27 ENCOUNTER — Telehealth: Payer: Self-pay | Admitting: Podiatry

## 2020-04-27 NOTE — Telephone Encounter (Signed)
I'm scheduled for sx next Tuesday. I need to reschedule that to the first week of December as I will have time saved up at work. I rescheduled pt's sx for Tuesday, 12/07. I told her someone from [facility] would call a day or two prior to let her know what time to arrive for pre-op and her sx time. Pt asked about the billing portion. I told her that she would get 3 bills, one from [place], one from [facility], and one from the anesthesiologist. I told her we would get our benefits two weeks before sx and that we could give her the codes and she could call [facility] and get their quotes as well as for the anesthesiologist. Pt stated she would call back to get the codes. Told pt to call us with any questions and/or concerns or if she needs to be seen by Dr. Charlsie Merles prior to sx.

## 2020-05-11 ENCOUNTER — Encounter: Payer: Self-pay | Admitting: Family Medicine

## 2020-05-11 ENCOUNTER — Other Ambulatory Visit: Payer: Self-pay | Admitting: Family Medicine

## 2020-05-11 DIAGNOSIS — B3731 Acute candidiasis of vulva and vagina: Secondary | ICD-10-CM

## 2020-05-11 MED ORDER — FLUCONAZOLE 150 MG PO TABS: ORAL_TABLET | ORAL | 0 refills | Status: DC

## 2020-06-03 ENCOUNTER — Other Ambulatory Visit: Payer: Self-pay | Admitting: Family Medicine

## 2020-06-03 ENCOUNTER — Encounter: Payer: Self-pay | Admitting: Family Medicine

## 2020-06-03 ENCOUNTER — Other Ambulatory Visit: Payer: Self-pay

## 2020-06-03 ENCOUNTER — Ambulatory Visit
Admission: EM | Admit: 2020-06-03 | Discharge: 2020-06-03 | Disposition: A | Discharge status: Home / Self Care | Payer: BC Managed Care – PPO | Attending: Emergency Medicine | Admitting: Emergency Medicine

## 2020-06-03 DIAGNOSIS — B3731 Acute candidiasis of vulva and vagina: Secondary | ICD-10-CM

## 2020-06-03 DIAGNOSIS — J069 Acute upper respiratory infection, unspecified: Secondary | ICD-10-CM

## 2020-06-03 DIAGNOSIS — Z1152 Encounter for screening for COVID-19: Secondary | ICD-10-CM | POA: Diagnosis not present

## 2020-06-03 MED ORDER — IBUPROFEN 800 MG PO TABS: 800.0000 mg | ORAL_TABLET | Freq: Three times a day (TID) | ORAL | 0 refills | Status: DC

## 2020-06-03 MED ORDER — DM-GUAIFENESIN ER 30-600 MG PO TB12: 1.0000 | ORAL_TABLET | Freq: Two times a day (BID) | ORAL | 0 refills | Status: DC

## 2020-06-03 MED ORDER — BENZONATATE 200 MG PO CAPS: 200.0000 mg | ORAL_CAPSULE | Freq: Three times a day (TID) | ORAL | 0 refills | Status: AC | PRN

## 2020-06-03 MED ORDER — FLUTICASONE PROPIONATE 50 MCG/ACT NA SUSP: 1.0000 | Freq: Every day | NASAL | 0 refills | Status: DC

## 2020-06-03 NOTE — ED Provider Notes (Signed)
[facility]    CSN: [id number] Arrival date & time: 06/03/20  1427      History   Chief Complaint Chief Complaint  Patient presents with  . Cough    HPI Jane Marshall is a 34 y.o. female presenting today for evaluation of URI symptoms.  Reports symptoms began 2 days ago.  Has been having productive cough, chest and nasal congestion, chills and fatigue.  Denies any close sick contacts.  Denies GI symptoms.  Using NyQuil without relief.  HPI  Past Medical History:  Diagnosis Date  . Allergy   . Anemia    high school  . Exotropia of left eye   . GERD (gastroesophageal reflux disease)   . H. pylori infection   . Headache(784.0)   . Plantar fasciitis     Patient Active Problem List   Diagnosis Date Noted  . Hydradenitis 05/28/2019  . Rash of foot 03/24/2019  . Depression 08/24/2017  . 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 GERD (gastroesophageal reflux disease) 12/15/2016  . Bilateral bunions 10/15/2014    Past Surgical History:  Procedure Laterality Date  . corrective foot surgery Bilateral   . wisdom teeth      OB History   No obstetric history on file.      Home Medications    Prior to Admission medications   Medication Sig Start Date End Date Taking? Authorizing Provider  Adapalene-Benzoyl Peroxide (EPIDUO) 0.1-2.5 % gel Apply 1 application topically at bedtime. 04/04/20   Mullis, Kiersten P, DO  benzonatate (TESSALON) 200 MG capsule Take 1 capsule (200 mg total) by mouth 3 (three) times daily as needed for up to 7 days for cough. 06/03/20 06/10/20  Tanishia Lemaster C, PA-C  chlorhexidine (PERIDEX) 0.12 % solution SWISH 1/2 OZ TWICE DAILY FOR 30 SEC THEN SPIT DO THIS FOR 5 DAYS 11/06/19   [provider]  dextromethorphan-guaiFENesin (MUCINEX DM) 30-600 MG 12hr tablet Take 1 tablet by mouth 2 (two) times daily. 06/03/20   Dennise Raabe C, PA-C  dicyclomine (BENTYL) 20 MG tablet Take 1 tablet (20 mg total) by mouth 2 (two) times daily. 01/05/20    Cathie Hoops, Amy V, PA-C  doxycycline (VIBRAMYCIN) 50 MG capsule Take 1 capsule (50 mg total) by mouth 2 (two) times daily. 03/31/20   Mullis, Kiersten P, DO  escitalopram (LEXAPRO) 10 MG tablet Take 10 mg by mouth daily.    [provider]  fluconazole (DIFLUCAN) 150 MG tablet Take one dose now, then take second dose in 72 hours. 05/11/20   Mullis, Kiersten P, DO  fluticasone (FLONASE) 50 MCG/ACT nasal spray Place 1-2 sprays into both nostrils daily. 06/03/20   Tandrea Kommer C, PA-C  hydrOXYzine (ATARAX/VISTARIL) 10 MG tablet Take 10-20 mg by mouth 3 (three) times daily as needed.    [provider]  ibuprofen (ADVIL) 800 MG tablet Take 1 tablet (800 mg total) by mouth 3 (three) times daily. 06/03/20   Julian Askin C, PA-C  Multiple Vitamin (MULTIVITAMIN ADULT PO) Take by mouth.    [provider]  norgestimate-ethinyl estradiol (SPRINTEC 28) 0.25-35 MG-MCG tablet Take 1 tablet by mouth daily. 03/02/20   Mullis, Kiersten P, DO  prazosin (MINIPRESS) 1 MG capsule Take 1-2 mg by mouth at bedtime.    [provider]  triamcinolone cream (KENALOG) 0.1 % Apply 1 application topically 2 (two) times daily as needed. 03/31/20   Mullis, Dara Lords, DO    Family History Family History  Problem Relation Age of Onset  .  Early death Mother   . Heart disease Mother   . Kidney disease Mother   . Cancer Father        pancreatic  . Pancreatic cancer Father   . Crohn's disease Sister   . Stomach cancer Maternal Aunt   . Colon cancer Neg Hx   . Colon polyps Neg Hx   . Esophageal cancer Neg Hx   . Rectal cancer Neg Hx     Social History Social History   Tobacco Use  . Smoking status: Former Smoker    Packs/day: 0.25    Types: Cigarettes, Cigars    Quit date: 09/12/2011    Years since quitting: 8.7  . Smokeless tobacco: Never Used  Substance Use Topics  . Alcohol use: Not Currently  . Drug use: No     Allergies   Patient has no known allergies.   Review of  Systems Review of Systems  Constitutional: Positive for chills and fatigue. Negative for activity change, appetite change and fever.  HENT: Positive for congestion, rhinorrhea and sinus pressure. Negative for ear pain, sore throat and trouble swallowing.   Eyes: Negative for discharge and redness.  Respiratory: Positive for cough. Negative for chest tightness and shortness of breath.   Cardiovascular: Negative for chest pain.  Gastrointestinal: Negative for abdominal pain, diarrhea, nausea and vomiting.  Musculoskeletal: Negative for myalgias.  Skin: Negative for rash.  Neurological: Negative for dizziness, light-headedness and headaches.     Physical Exam Triage Vital Signs ED Triage Vitals [06/03/20 1558]  Enc Vitals Group     BP 110/72     Pulse Rate 69     Resp 18     Temp 98.3 F (36.8 C)     Temp Source Oral     SpO2 99 %     Weight      Height      Head Circumference      Peak Flow      Pain Score 0     Pain Loc      Pain Edu?      Excl. in GC?    No data found.  Updated Vital Signs BP 110/72 (BP Location: Left Arm)   Pulse 69   Temp 98.3 F (36.8 C) (Oral)   Resp 18   LMP 06/03/2020   SpO2 99%   Visual Acuity Right Eye Distance:   Left Eye Distance:   Bilateral Distance:    Right Eye Near:   Left Eye Near:    Bilateral Near:     Physical Exam Vitals and nursing note reviewed.  Constitutional:      Appearance: She is well-developed.     Comments: No acute distress  HENT:     Head: Normocephalic and atraumatic.     Ears:     Comments: Bilateral effusions, clear    Nose: Nose normal.     Mouth/Throat:     Comments: Oral mucosa pink and moist, no tonsillar enlargement or exudate. Posterior pharynx patent and nonerythematous, no uvula deviation or swelling. Normal phonation.  Eyes:     Conjunctiva/sclera: Conjunctivae normal.  Cardiovascular:     Rate and Rhythm: Normal rate.  Pulmonary:     Effort: Pulmonary effort is normal. No respiratory  distress.     Comments: Breathing comfortably at rest, CTABL, no wheezing, rales or other adventitious sounds auscultated Abdominal:     General: There is no distension.  Musculoskeletal:        General: Normal range of  motion.     Cervical back: Neck supple.  Skin:    General: Skin is warm and dry.  Neurological:     Mental Status: She is alert and oriented to person, place, and time.      UC Treatments / Results  Labs (all labs ordered are listed, but only abnormal results are displayed) Labs Reviewed  NOVEL CORONAVIRUS, NAA    EKG   Radiology No results found.  Procedures Procedures (including critical care time)  Medications Ordered in UC Medications - No data to display  Initial Impression / Assessment and Plan / UC Course  I have reviewed the triage vital signs and the nursing notes.  Pertinent labs & imaging results that were available during my care of the patient were reviewed by me and considered in my medical decision making (see chart for details).     PCR Covid pending, suspect likely viral URI, recommend symptomatic and supportive care rest and fluids.  Discussed strict return precautions. Patient verbalized understanding and is agreeable with plan.  Final Clinical Impressions(s) / UC Diagnoses   Final diagnoses:  Encounter for screening for COVID-19  Viral URI with cough     Discharge Instructions     COVID test pending Tessalon for cough Mucinex dm for cough/congestion Flonase nasal spray Tylneol and ibuprofen Rest and fluids Follow up if not improving or worsening    ED Prescriptions    Medication Sig Dispense Auth. Provider   benzonatate (TESSALON) 200 MG capsule Take 1 capsule (200 mg total) by mouth 3 (three) times daily as needed for up to 7 days for cough. 28 capsule Leilyn Frayre C, PA-C   fluticasone (FLONASE) 50 MCG/ACT nasal spray Place 1-2 sprays into both nostrils daily. 20 g Sher Hellinger C, PA-C   ibuprofen (ADVIL)  800 MG tablet Take 1 tablet (800 mg total) by mouth 3 (three) times daily. 21 tablet Melena Hayes C, PA-C   dextromethorphan-guaiFENesin (MUCINEX DM) 30-600 MG 12hr tablet Take 1 tablet by mouth 2 (two) times daily. 20 tablet Milli Woolridge, [place] C, PA-C     PDMP not reviewed this encounter.   Lew Dawes, [place] 06/03/20 1628

## 2020-06-03 NOTE — Discharge Instructions (Signed)
COVID test pending Tessalon for cough Mucinex dm for cough/congestion Flonase nasal spray Tylneol and ibuprofen Rest and fluids Follow up if not improving or worsening

## 2020-06-03 NOTE — ED Triage Notes (Signed)
Pt c/o productive cough, chest/nasal congestion, and chills x2 days. Denies sore throat but states has white spots in the back of her mouth.

## 2020-06-04 ENCOUNTER — Other Ambulatory Visit: Payer: Self-pay | Admitting: Family Medicine

## 2020-06-04 DIAGNOSIS — L732 Hidradenitis suppurativa: Secondary | ICD-10-CM

## 2020-06-04 LAB — NOVEL CORONAVIRUS, NAA: SARS-CoV-2, NAA: NOT DETECTED

## 2020-06-04 LAB — SARS-COV-2, NAA 2 DAY TAT

## 2020-06-04 MED ORDER — NORGESTIMATE-ETH ESTRADIOL 0.25-35 MG-MCG PO TABS: 1.0000 | ORAL_TABLET | Freq: Every day | ORAL | 11 refills | Status: DC

## 2020-06-04 MED ORDER — FLUCONAZOLE 150 MG PO TABS: ORAL_TABLET | ORAL | 0 refills | Status: DC

## 2020-06-14 ENCOUNTER — Encounter: Payer: Self-pay | Admitting: Family Medicine

## 2020-06-16 ENCOUNTER — Other Ambulatory Visit: Payer: Self-pay | Admitting: Family Medicine

## 2020-06-16 DIAGNOSIS — B3731 Acute candidiasis of vulva and vagina: Secondary | ICD-10-CM

## 2020-06-16 DIAGNOSIS — L732 Hidradenitis suppurativa: Secondary | ICD-10-CM

## 2020-06-16 MED ORDER — DOXYCYCLINE HYCLATE 100 MG PO TABS: 100.0000 mg | ORAL_TABLET | Freq: Two times a day (BID) | ORAL | 2 refills | Status: DC

## 2020-06-16 MED ORDER — CLINDAMYCIN PHOSPHATE 1 % EX GEL: Freq: Two times a day (BID) | CUTANEOUS | 2 refills | Status: DC

## 2020-06-16 MED ORDER — FLUCONAZOLE 150 MG PO TABS: ORAL_TABLET | ORAL | 0 refills | Status: DC

## 2020-07-16 ENCOUNTER — Other Ambulatory Visit: Payer: Self-pay | Admitting: Family Medicine

## 2020-07-16 DIAGNOSIS — B373 Candidiasis of vulva and vagina: Secondary | ICD-10-CM

## 2020-07-16 DIAGNOSIS — B3731 Acute candidiasis of vulva and vagina: Secondary | ICD-10-CM

## 2020-07-18 ENCOUNTER — Other Ambulatory Visit: Payer: Self-pay | Admitting: Family Medicine

## 2020-07-18 DIAGNOSIS — B373 Candidiasis of vulva and vagina: Secondary | ICD-10-CM

## 2020-07-18 DIAGNOSIS — B3731 Acute candidiasis of vulva and vagina: Secondary | ICD-10-CM

## 2020-07-19 ENCOUNTER — Other Ambulatory Visit: Payer: Self-pay | Admitting: Family Medicine

## 2020-07-19 DIAGNOSIS — B3731 Acute candidiasis of vulva and vagina: Secondary | ICD-10-CM

## 2020-07-19 DIAGNOSIS — B373 Candidiasis of vulva and vagina: Secondary | ICD-10-CM

## 2020-07-21 DIAGNOSIS — M79676 Pain in unspecified toe(s): Secondary | ICD-10-CM

## 2020-08-16 MED ORDER — ONDANSETRON HCL 4 MG PO TABS: 4.0000 mg | ORAL_TABLET | Freq: Three times a day (TID) | ORAL | 0 refills | Status: DC | PRN

## 2020-08-16 MED ORDER — OXYCODONE-ACETAMINOPHEN 10-325 MG PO TABS: 1.0000 | ORAL_TABLET | ORAL | 0 refills | Status: DC | PRN

## 2020-08-16 NOTE — Addendum Note (Signed)
Addended by: Lenn Sink on: 08/16/2020 04:37 PM   Modules accepted: Orders

## 2020-08-17 ENCOUNTER — Encounter: Payer: Self-pay | Admitting: Podiatry

## 2020-08-17 DIAGNOSIS — M2012 Hallux valgus (acquired), left foot: Secondary | ICD-10-CM

## 2020-08-17 DIAGNOSIS — M2042 Other hammer toe(s) (acquired), left foot: Secondary | ICD-10-CM

## 2020-08-17 DIAGNOSIS — M21542 Acquired clubfoot, left foot: Secondary | ICD-10-CM

## 2020-08-18 ENCOUNTER — Other Ambulatory Visit: Payer: Self-pay | Admitting: Podiatry

## 2020-08-18 ENCOUNTER — Telehealth: Payer: Self-pay

## 2020-08-18 MED ORDER — HYDROMORPHONE HCL 4 MG PO TABS: 4.0000 mg | ORAL_TABLET | ORAL | 0 refills | Status: AC | PRN

## 2020-08-18 NOTE — Telephone Encounter (Signed)
Post-op call: Patient states she is having some pain today. Pain is an 8/10 at this time. Patient has taken Rx pain medication and it is causing itching and not relieving the pain. Advised patient to stop taking the Rx medication at this time. Patient denies feve, chills, nausea, vomiting, shortness of breath or tightness in the calf. Patient report bandage is clean dry and intact. Patient denies tightness, bleeding, or drainage coming through the bandage. Patient reports understanding post op sheet. Patient would like a different pain medication Rx at this time. Please advise. Patient uses [facility] in [place] on [address].

## 2020-08-18 NOTE — Telephone Encounter (Signed)
I called in prescription

## 2020-08-23 ENCOUNTER — Encounter: Payer: BC Managed Care – PPO | Admitting: Podiatry

## 2020-08-23 ENCOUNTER — Telehealth: Payer: Self-pay | Admitting: Podiatry

## 2020-08-23 NOTE — Telephone Encounter (Signed)
Patients mom called in this morning stating patient was arrested and wanted to cancel Post Op appointment and inquire about services patient could receive while in jail. I canceled the patient appointment and asked if she would like to speak with surgery coordinator because I wasn't sure where to direct patients mom. The patients mom stated she just wanted to let us know patient wouldn't be able to complete Post Ops and she had left messages but was following up to be sure we was aware.

## 2020-09-24 IMAGING — DX PELVIS - 1-2 VIEW
1 series · 1 of 1 positions shown · non-contrast
Comparison: None

CLINICAL DATA: Status post fall.

EXAM:
PELVIS - 1-2 VIEW

[pelvis ap]
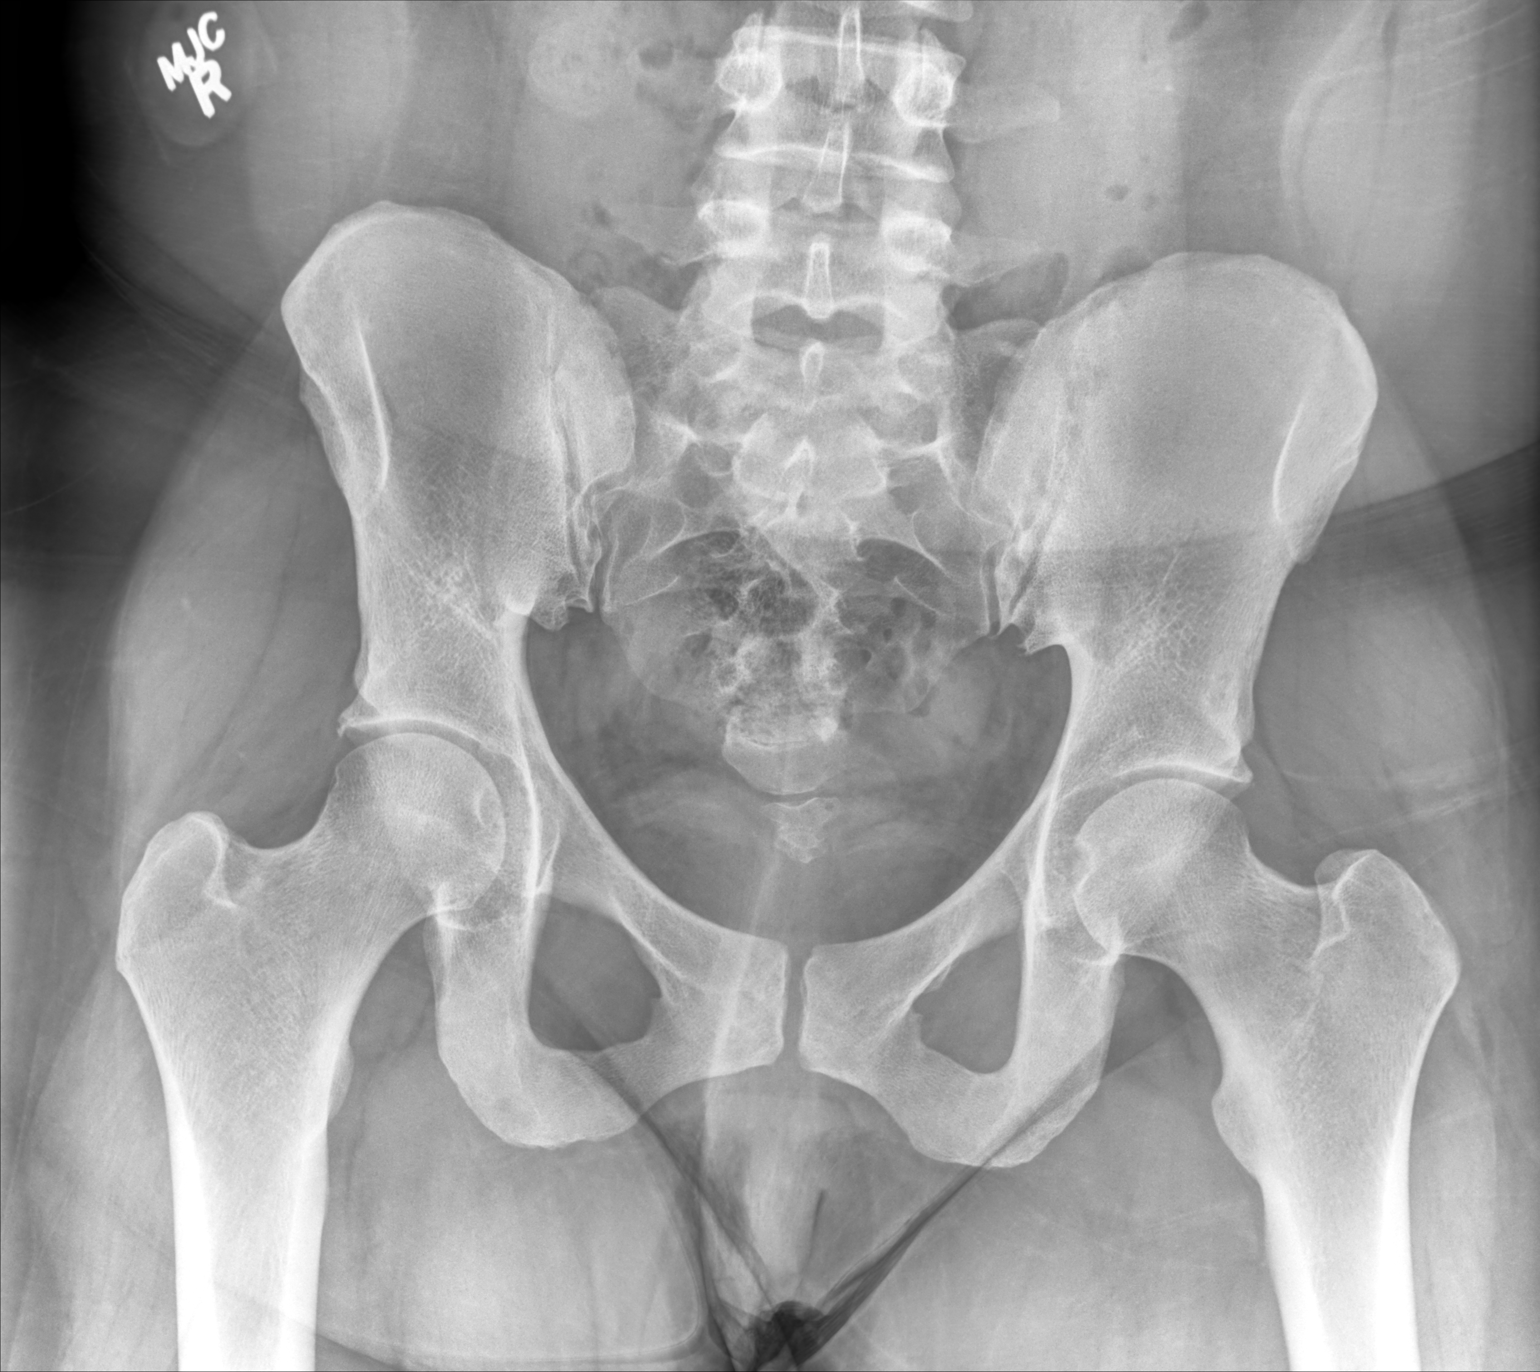

[1 of 1 positions shown; findings below may reference images not displayed]

FINDINGS: There is no evidence of pelvic fracture or diastasis. No pelvic bone
lesions are seen.
IMPRESSION: Negative.

## 2020-10-06 ENCOUNTER — Encounter: Payer: Self-pay | Admitting: Podiatry

## 2020-10-19 ENCOUNTER — Telehealth: Payer: Self-pay | Admitting: Podiatry

## 2020-10-19 NOTE — Telephone Encounter (Signed)
Dr.Haq with [facility] has requested return call regarding patient.   Dr.Haq [phone]

## 2020-10-20 ENCOUNTER — Other Ambulatory Visit: Payer: Self-pay

## 2020-10-20 ENCOUNTER — Ambulatory Visit (INDEPENDENT_AMBULATORY_CARE_PROVIDER_SITE_OTHER): Payer: BC Managed Care – PPO | Admitting: Podiatry

## 2020-10-20 ENCOUNTER — Ambulatory Visit (INDEPENDENT_AMBULATORY_CARE_PROVIDER_SITE_OTHER): Payer: Self-pay

## 2020-10-20 ENCOUNTER — Encounter: Payer: Self-pay | Admitting: Podiatry

## 2020-10-20 DIAGNOSIS — M2042 Other hammer toe(s) (acquired), left foot: Secondary | ICD-10-CM

## 2020-10-20 DIAGNOSIS — M2041 Other hammer toe(s) (acquired), right foot: Secondary | ICD-10-CM

## 2020-10-20 NOTE — Progress Notes (Signed)
Subjective:   Patient ID: Jane Marshall, female   DOB: 35 y.o.   MRN: [id number]   HPI Patient presents after having had surgery several months ago and unfortunately afterwards she was incarcerated and she was not able to come in.  We had made numerous attempts to contact her were not able to and finally we were able to make contact and she presents today approximate 8 weeks after having surgery     ROS      Objective:  Physical Exam  Neurovascular status intact negative Denna Haggard' sign was noted with patient's left foot actually healing very well incisions intact wound edges well coapted crusted tissue consistent with 8 weeks of dressing pins intact second and third digits     Assessment:  Overall doing very well after having had foot surgery 8 weeks ago and has not been able to see her     Plan:  H&P x-ray reviewed pins removed sterile dressings applied all stitches are removed wound edges are coapted very well no indications of drainage no other pathology with good range of motion currently.  I reviewed this with her and at this point she can return to soft shoe gear she will work on her range of motion and will wear compression stocking and at this point I do not need to see her back due to her incarceration but if there is any issues she will inform the physician at prison and let us now  X-rays indicate everything is healing well no indications of any pathology associated with the surgery she had had

## 2022-02-14 ENCOUNTER — Encounter: Payer: Self-pay | Admitting: *Deleted

## 2022-10-23 ENCOUNTER — Ambulatory Visit (INDEPENDENT_AMBULATORY_CARE_PROVIDER_SITE_OTHER): Payer: Medicaid Other | Admitting: Student

## 2022-10-23 ENCOUNTER — Other Ambulatory Visit (HOSPITAL_COMMUNITY)
Admission: RE | Admit: 2022-10-23 | Discharge: 2022-10-23 | Disposition: A | Discharge status: Home / Self Care | Payer: Medicaid Other | Source: Ambulatory Visit | Attending: Family Medicine | Admitting: Family Medicine

## 2022-10-23 ENCOUNTER — Encounter: Payer: Self-pay | Admitting: Student

## 2022-10-23 VITALS — BP 137/96 | HR 73 | Ht 67.0 in | Wt 264.2 lb

## 2022-10-23 DIAGNOSIS — Z114 Encounter for screening for human immunodeficiency virus [HIV]: Secondary | ICD-10-CM | POA: Diagnosis not present

## 2022-10-23 DIAGNOSIS — F322 Major depressive disorder, single episode, severe without psychotic features: Secondary | ICD-10-CM

## 2022-10-23 DIAGNOSIS — L732 Hidradenitis suppurativa: Secondary | ICD-10-CM

## 2022-10-23 DIAGNOSIS — Z124 Encounter for screening for malignant neoplasm of cervix: Secondary | ICD-10-CM | POA: Diagnosis not present

## 2022-10-23 DIAGNOSIS — Z1159 Encounter for screening for other viral diseases: Secondary | ICD-10-CM

## 2022-10-23 DIAGNOSIS — R0981 Nasal congestion: Secondary | ICD-10-CM | POA: Diagnosis not present

## 2022-10-23 DIAGNOSIS — Z13220 Encounter for screening for lipoid disorders: Secondary | ICD-10-CM | POA: Diagnosis not present

## 2022-10-23 DIAGNOSIS — R6889 Other general symptoms and signs: Secondary | ICD-10-CM | POA: Diagnosis not present

## 2022-10-23 DIAGNOSIS — Z131 Encounter for screening for diabetes mellitus: Secondary | ICD-10-CM

## 2022-10-23 DIAGNOSIS — U071 COVID-19: Secondary | ICD-10-CM | POA: Insufficient documentation

## 2022-10-23 LAB — POC SOFIA 2 FLU + SARS ANTIGEN FIA
Influenza A, POC: NEGATIVE
Influenza B, POC: POSITIVE — AB
SARS Coronavirus 2 Ag: POSITIVE — AB

## 2022-10-23 MED ORDER — ESCITALOPRAM OXALATE 10 MG PO TABS: 10.0000 mg | ORAL_TABLET | Freq: Every day | ORAL | 0 refills | Status: DC

## 2022-10-23 MED ORDER — NORGESTIMATE-ETH ESTRADIOL 0.25-35 MG-MCG PO TABS: 1.0000 | ORAL_TABLET | Freq: Every day | ORAL | 11 refills | Status: AC

## 2022-10-23 MED ORDER — DOXYCYCLINE HYCLATE 100 MG PO TABS: 100.0000 mg | ORAL_TABLET | Freq: Two times a day (BID) | ORAL | 2 refills | Status: AC

## 2022-10-23 NOTE — Assessment & Plan Note (Signed)
Having depression symptoms and a lot of circumstantial stress. Resume Lexapro 10 mg daily. No SI/HI. F/u in 6 weeks

## 2022-10-23 NOTE — Assessment & Plan Note (Signed)
Collected Pap and STI screening although patient denies sexual activity. Moderate amount of white discharge on exam. Resume Sprintec to assist in regulating menses (prior sexually active with female partner) F/u in 6 weeks to see if menses are regulating

## 2022-10-23 NOTE — Assessment & Plan Note (Deleted)
Rhinorrhea, congestion for 3 days. Likely viral URI. Negative COVID/Flu testing today Encouraged supportive care with nasal saline, hydration, etc. Advised to let me know if symptoms persist past 10 days.

## 2022-10-23 NOTE — Patient Instructions (Signed)
It was great seeing you today.  We completed your Pap smear and got labs today. I will call if anything is abnormal.  Continue hydrating, using nasal saline for your symptoms. If your symptoms are still present this weekend, let me know and I will send in an antibiotic.  If you have any questions or concerns, please feel free to call the clinic.   Have a wonderful day,  Dr. Orvis Brill [facility] [phone]

## 2022-10-23 NOTE — Assessment & Plan Note (Signed)
Positive for COVID and Flu B today. Tried to call patient with results but phone number is not correct. Sent MyChart message and also will send letter.

## 2022-10-23 NOTE — Progress Notes (Signed)
    SUBJECTIVE:    Jane Marshall is a 37 y.o. who presents today for Pap smear and sick symptoms.  Encounter for Pap smear Last Pap 2020 NILM. Last menses June 2023. Was previously regulated through [facility]. Would like to restart (no hx DVT/PE, tobacoo use, migraines with aura). Not sexually active for past 2 years. Denies any vaginal odor, change in discharge.  Sick symptoms Review of systems form notable for cold symptoms (runny nose since 3 days ago). Had exposure with little kid, then got sick.  No fever, nausea, vomiting, diarrhea. Would like COVID/flu testing.  Depressed mood Going through court case right now. Recently incaracerated for 2 years. Would like to restart Lexapro, which she was previously on and was very helpful.    OBJECTIVE:   BP (!) 137/96   Pulse 73   Ht 5\' 7"  (1.702 m)   Wt 264 lb 3.2 oz (119.8 kg)   SpO2 99%   BMI 41.38 kg/m   General: Alert and cooperative and appears to be in no acute distress HEENT: Nasal congestion. No oropharyngeal erythema. Cardio: Normal S1 and S2, no S3 or S4. Rhythm is regular. No murmurs or rubs.   Pulm: Clear to auscultation bilaterally, no crackles, wheezing, or diminished breath sounds. Normal respiratory effort GU: Desiree, CMA present as chaperone. External vulva and vagina nonerythematous, without any obvious lesions or rash. Moderate amount of white discharge.  Normal ruggae of vaginal walls.  Cervix is non erythematous and non-friable.    ASSESSMENT/PLAN:   Depression Having depression symptoms and a lot of circumstantial stress. Resume Lexapro 10 mg daily. No SI/HI. F/u in 6 weeks  Encounter for Papanicolaou smear of cervix Collected Pap and STI screening although patient denies sexual activity. Moderate amount of white discharge on exam. Resume Sprintec to assist in regulating menses (prior sexually active with female partner) F/u in 6 weeks to see if menses are regulating  COVID Positive for COVID and Flu  B today. Tried to call patient with results but phone number is not correct. Sent MyChart message and also will send letter.        Orvis Brill, [place]

## 2022-10-24 LAB — LIPID PANEL
Chol/HDL Ratio: 4.3 ratio (ref 0.0–4.4)
Cholesterol, Total: 164 mg/dL (ref 100–199)
HDL: 38 mg/dL — ABNORMAL LOW (ref >39)
LDL Chol Calc (NIH): 115 mg/dL — ABNORMAL HIGH (ref 0–99)
Triglycerides: 54 mg/dL (ref 0–149)
VLDL Cholesterol Cal: 11 mg/dL (ref 5–40)

## 2022-10-24 LAB — HIV ANTIBODY (ROUTINE TESTING W REFLEX): HIV Screen 4th Generation wRfx: NONREACTIVE

## 2022-10-24 LAB — CBC
Hematocrit: 37 % (ref 34.0–46.6)
Hemoglobin: 12.3 g/dL (ref 11.1–15.9)
MCH: 29.5 pg (ref 26.6–33.0)
MCHC: 33.2 g/dL (ref 31.5–35.7)
MCV: 89 fL (ref 79–97)
Platelets: 172 10*3/uL (ref 150–450)
RBC: 4.17 x10E6/uL (ref 3.77–5.28)
RDW: 12.2 % (ref 11.7–15.4)
WBC: 4.6 10*3/uL (ref 3.4–10.8)

## 2022-10-24 LAB — HCV INTERPRETATION

## 2022-10-24 LAB — HEMOGLOBIN A1C
Est. average glucose Bld gHb Est-mCnc: 120 mg/dL
Hgb A1c MFr Bld: 5.8 % — ABNORMAL HIGH (ref 4.8–5.6)

## 2022-10-24 LAB — HCV AB W REFLEX TO QUANT PCR: HCV Ab: NONREACTIVE

## 2022-10-25 LAB — CYTOLOGY - PAP
Adequacy: ABSENT
Chlamydia: NEGATIVE
Comment: NEGATIVE
Comment: NEGATIVE
Comment: NORMAL
Diagnosis: NEGATIVE
Neisseria Gonorrhea: NEGATIVE
Trichomonas: NEGATIVE

## 2022-11-14 ENCOUNTER — Other Ambulatory Visit: Payer: Self-pay | Admitting: Student

## 2023-07-24 ENCOUNTER — Other Ambulatory Visit: Payer: Self-pay

## 2023-07-24 NOTE — Telephone Encounter (Signed)
Patient's sister, Archie Patten, calls nurse line requesting prescription of escitalopram refill.   She states that this needs to be faxed to [facility] at [phone]. Attention Electa Sniff prescription to be printed. Please sign and I can fax to the above number.   Veronda Prude, RN

## 2023-07-25 MED ORDER — ESCITALOPRAM OXALATE 10 MG PO TABS: 10.0000 mg | ORAL_TABLET | Freq: Every day | ORAL | 1 refills | Status: AC

## 2023-07-25 MED ORDER — ESCITALOPRAM OXALATE 10 MG PO TABS: 10.0000 mg | ORAL_TABLET | Freq: Every day | ORAL | 1 refills | Status: DC

## 2023-07-25 NOTE — Addendum Note (Signed)
Addended by: Veronda Prude on: 07/25/2023 10:44 AM   Modules accepted: Orders

## 2024-02-19 ENCOUNTER — Encounter: Payer: Self-pay | Admitting: *Deleted
